# Patient Record
Sex: Male | Born: 1940 | Race: White | Smoking: Never smoker
Health system: Southern US, Community
[De-identification: ages and names within clinical notes are randomized; demographics above are authoritative.]

## PROBLEM LIST (undated history)

## (undated) DIAGNOSIS — R1310 Dysphagia, unspecified: Secondary | ICD-10-CM

## (undated) DIAGNOSIS — D649 Anemia, unspecified: Secondary | ICD-10-CM

## (undated) DIAGNOSIS — E119 Type 2 diabetes mellitus without complications: Secondary | ICD-10-CM

## (undated) DIAGNOSIS — G609 Hereditary and idiopathic neuropathy, unspecified: Secondary | ICD-10-CM

## (undated) DIAGNOSIS — I10 Essential (primary) hypertension: Secondary | ICD-10-CM

## (undated) DIAGNOSIS — I639 Cerebral infarction, unspecified: Secondary | ICD-10-CM

## (undated) DIAGNOSIS — M1990 Unspecified osteoarthritis, unspecified site: Secondary | ICD-10-CM

## (undated) DIAGNOSIS — J449 Chronic obstructive pulmonary disease, unspecified: Secondary | ICD-10-CM

## (undated) DIAGNOSIS — IMO0001 Reserved for inherently not codable concepts without codable children: Secondary | ICD-10-CM

## (undated) DIAGNOSIS — E559 Vitamin D deficiency, unspecified: Secondary | ICD-10-CM

## (undated) DIAGNOSIS — F329 Major depressive disorder, single episode, unspecified: Secondary | ICD-10-CM

## (undated) DIAGNOSIS — E785 Hyperlipidemia, unspecified: Secondary | ICD-10-CM

## (undated) DIAGNOSIS — I1310 Hypertensive heart and chronic kidney disease without heart failure, with stage 1 through stage 4 chronic kidney disease, or unspecified chronic kidney disease: Secondary | ICD-10-CM

## (undated) DIAGNOSIS — F3289 Other specified depressive episodes: Secondary | ICD-10-CM

## (undated) HISTORY — DX: Dysphagia, unspecified: R13.10

## (undated) HISTORY — DX: Vitamin D deficiency, unspecified: E55.9

## (undated) HISTORY — DX: Essential (primary) hypertension: I10

## (undated) HISTORY — DX: Unspecified osteoarthritis, unspecified site: M19.90

## (undated) HISTORY — DX: Reserved for inherently not codable concepts without codable children: IMO0001

## (undated) HISTORY — DX: Type 2 diabetes mellitus without complications: E11.9

## (undated) HISTORY — DX: Cerebral infarction, unspecified: I63.9

## (undated) HISTORY — DX: Hyperlipidemia, unspecified: E78.5

## (undated) HISTORY — DX: Other specified depressive episodes: F32.89

## (undated) HISTORY — DX: Hypertensive heart and chronic kidney disease without heart failure, with stage 1 through stage 4 chronic kidney disease, or unspecified chronic kidney disease: I13.10

## (undated) HISTORY — DX: Anemia, unspecified: D64.9

## (undated) HISTORY — DX: Major depressive disorder, single episode, unspecified: F32.9

## (undated) HISTORY — DX: Hereditary and idiopathic neuropathy, unspecified: G60.9

## (undated) HISTORY — DX: Chronic obstructive pulmonary disease, unspecified: J44.9

---

## 2011-09-26 ENCOUNTER — Ambulatory Visit (HOSPITAL_COMMUNITY)
Admission: RE | Admit: 2011-09-26 | Discharge: 2011-09-26 | Disposition: A | Discharge status: Home / Self Care | Payer: Medicaid Other | Source: Ambulatory Visit | Attending: Internal Medicine | Admitting: Internal Medicine

## 2011-09-26 ENCOUNTER — Ambulatory Visit (HOSPITAL_COMMUNITY)
Admission: RE | Admit: 2011-09-26 | Discharge: 2011-09-26 | Disposition: A | Discharge status: Home / Self Care | Payer: Medicare Other | Source: Ambulatory Visit | Attending: Family Medicine | Admitting: Family Medicine

## 2011-09-26 DIAGNOSIS — R1310 Dysphagia, unspecified: Secondary | ICD-10-CM

## 2011-09-26 DIAGNOSIS — R1313 Dysphagia, pharyngeal phase: Secondary | ICD-10-CM | POA: Insufficient documentation

## 2011-09-26 NOTE — Procedures (Signed)
Objective Swallowing Evaluation: Outpatient Modified Barium Swallowing Study  Patient Details  Name: Eric Graves MRN: [id number] Date of Birth: 09/26/1940  Today's Date: 09/26/2011 Time: 1130-1205 SLP Time Calculation (min): 35 min  Past Medical History: No past medical history on file. Past Surgical History: No past surgical history on file. HPI:  71 y.o. male referred for an outpatient MBSS from SNF to assess swallow function and the ability to upgrade his liquids. Pmhx: COPD, CVA, and obstructive, chronic bronchitis.     Assessment / Plan / Recommendation Clinical Impression  Dysphagia Diagnosis: Mild pharyngeal phase dysphagia Clinical impression: Demonstrates a mild oropharyngeal phase dysphagia marked primarily by a sensory/motor deficit, particularly with base of tongue/oral containment of the liquid bolus resulting in premature spillage into the pharynx (nectar-thick) and laryngeal vestibule (thin liquids) before the swallow trigger.  Both silent and sensed aspiration occured with thin liquids, however the large volume was most concerning.  Given patient's sensory/motor deficits coupled with reduced vocal cord adduction protection, patient would be a very high aspiration pna risk if he proceeded to consume thin liquids exclusively.  Recommend to continue current diet with the primary SLP consideration of a water protocol with trained caregivers/staff.    Treatment Recommendation  Defer treatment plan to SLP at (Comment) (SNF level SLP)    Diet Recommendation Dysphagia 3 (Mechanical Soft);Nectar-thick liquid (consider water protocol)   Liquid Administration via: Cup Medication Administration: Whole meds with puree Supervision: Patient able to self feed;Full supervision/cueing for compensatory strategies Compensations: Small sips/bites    Follow Up Recommendations  Skilled Nursing facility    Pertinent Vitals/Pain n/a    Reason for Referral Objectively evaluate swallowing  function   Pharyngeal Phase Pharyngeal Phase: Impaired   Cervical Esophageal Phase Cervical Esophageal Phase: [facility]    Myra Rude, M.S.,CCC-SLP Pager [phone] 09/26/2011, 1:59 PM

## 2012-05-26 ENCOUNTER — Non-Acute Institutional Stay (SKILLED_NURSING_FACILITY): Payer: Medicare Other | Admitting: Adult Health

## 2012-05-26 ENCOUNTER — Encounter: Payer: Self-pay | Admitting: Adult Health

## 2012-05-26 DIAGNOSIS — I639 Cerebral infarction, unspecified: Secondary | ICD-10-CM

## 2012-05-26 DIAGNOSIS — E785 Hyperlipidemia, unspecified: Secondary | ICD-10-CM

## 2012-05-26 DIAGNOSIS — R1319 Other dysphagia: Secondary | ICD-10-CM

## 2012-05-26 DIAGNOSIS — E1149 Type 2 diabetes mellitus with other diabetic neurological complication: Secondary | ICD-10-CM

## 2012-05-26 DIAGNOSIS — I6350 Cerebral infarction due to unspecified occlusion or stenosis of unspecified cerebral artery: Secondary | ICD-10-CM

## 2012-05-26 DIAGNOSIS — I10 Essential (primary) hypertension: Secondary | ICD-10-CM

## 2012-05-26 DIAGNOSIS — G609 Hereditary and idiopathic neuropathy, unspecified: Secondary | ICD-10-CM

## 2012-05-26 DIAGNOSIS — G629 Polyneuropathy, unspecified: Secondary | ICD-10-CM

## 2012-07-21 ENCOUNTER — Encounter: Payer: Self-pay | Admitting: Adult Health

## 2012-07-21 ENCOUNTER — Non-Acute Institutional Stay (SKILLED_NURSING_FACILITY): Payer: Medicare Other | Admitting: Adult Health

## 2012-07-21 DIAGNOSIS — E785 Hyperlipidemia, unspecified: Secondary | ICD-10-CM

## 2012-07-21 DIAGNOSIS — E1149 Type 2 diabetes mellitus with other diabetic neurological complication: Secondary | ICD-10-CM | POA: Insufficient documentation

## 2012-07-21 DIAGNOSIS — G609 Hereditary and idiopathic neuropathy, unspecified: Secondary | ICD-10-CM

## 2012-07-21 DIAGNOSIS — I10 Essential (primary) hypertension: Secondary | ICD-10-CM | POA: Insufficient documentation

## 2012-07-21 DIAGNOSIS — R1319 Other dysphagia: Secondary | ICD-10-CM

## 2012-07-21 DIAGNOSIS — G629 Polyneuropathy, unspecified: Secondary | ICD-10-CM | POA: Insufficient documentation

## 2012-07-21 NOTE — Assessment & Plan Note (Signed)
Will continue lipitor 20 mg daily  

## 2012-07-21 NOTE — Progress Notes (Signed)
Patient ID: Eric Graves, male   DOB: 23-Aug-1940, 72 y.o.   MRN: [id number]  FACILITY: [name]   No Known Allergies   Chief Complaint  Patient presents with  . Hospitalization Follow-up    HPI: He is being seen for the medical management of his chronic illnesses; there are no concerns being voiced by the nursing staff; there are no signs of aspiration present there is no overall change in his status; he cannot fully participate in the hpi or ros.   Past Medical History  Diagnosis Date  . Hyperlipidemia   . Diabetes mellitus without complication   . Anemia   . Hypertension   . Stroke   . COPD (chronic obstructive pulmonary disease)   . Arthritis   . Dysphagia     No past surgical history on file.  VITAL SIGNS BP 123/67  Pulse 68  Ht 5\' 5"  (1.651 m)  Wt 159 lb (72.122 kg)  BMI 26.46 kg/m2   Patient's Medications  New Prescriptions   No medications on file  Previous Medications   ATORVASTATIN (LIPITOR) 20 MG TABLET    Take 20 mg by mouth daily.   CARVEDILOL (COREG) 6.25 MG TABLET    Take 6.25 mg by mouth 2 (two) times daily with a meal.   CLOPIDOGREL (PLAVIX) 75 MG TABLET    Take 75 mg by mouth daily.   FOOD THICKENER (THICK IT) POWD    Take 1 Container by mouth as needed (for nectar thick liquids).   GABAPENTIN (NEURONTIN) 400 MG CAPSULE    Take 400 mg by mouth daily.   INSULIN ASPART (NOVOLOG) 100 UNIT/ML INJECTION    Inject 5 Units into the skin 3 (three) times daily before meals. 5 units prior to meals for cbg >= 150   INSULIN GLARGINE (LANTUS) 100 UNIT/ML INJECTION    Inject 20 Units into the skin at bedtime.   IPRATROPIUM-ALBUTEROL (DUONEB) 0.5-2.5 (3) MG/3ML SOLN    Take 3 mLs by nebulization every 6 (six) hours as needed.  Modified Medications   No medications on file  Discontinued Medications   DICLOFENAC SODIUM (VOLTAREN) 1 % GEL    Apply 2 g topically 4 (four) times daily as needed (to fingers of left hand).    SIGNIFICANT DIAGNOSTIC EXAMS  07-31-11:  CXR:  mild b/l perihilar interstitial pattern, c/w pneumonitis or fluid overload  LABS REVIEWED:  01-27-12: wbc 10.8; hgb 13.7; hct 40.9; mcv 93.4; plt 330; glcuose 115; bun 35; creat 1.46; k+ 5.4;na++ 143 liver normal albumin 3.4; hgb a1c 7.2    Review of Systems  Unable to perform ROS   Physical Exam  Constitutional: He appears well-developed and well-nourished.  Neck: Neck supple. No thyromegaly present.  Cardiovascular: Normal rate, regular rhythm and intact distal pulses.   Respiratory: Effort normal and breath sounds normal.  GI: Soft. Bowel sounds are normal. He exhibits no distension. There is no tenderness.  Musculoskeletal: He exhibits no edema.  Has hemiparesis  Neurological: He is alert.  Orientated to self  Skin: Skin is warm and dry.  Psychiatric: He has a normal mood and affect.       ASSESSMENT/ PLAN:  Type II or unspecified type diabetes mellitus with neurological manifestations, not stated as uncontrolled(250.60) Will continue lantus 20 units daily and novolog 5 units prior to meals for cbg >=150 and will monitor  Other and unspecified hyperlipidemia Will continue lipitor 20 mg daily  Peripheral neuropathy Will continue neurontin 400 mg daily   Essential hypertension, benign Will  continue coreg 6.25 mg twice daily   Other dysphagia Will continue nectar thick liquids; no signs of aspiration present.    Will check cbc; cmp; lipids; hgb a1c next blood draw

## 2012-07-21 NOTE — Assessment & Plan Note (Signed)
Will continue neurontin 400 mg daily

## 2012-07-21 NOTE — Assessment & Plan Note (Signed)
Will continue lantus 20 units daily and novolog 5 units prior to meals for cbg >=150 and will monitor

## 2012-07-21 NOTE — Assessment & Plan Note (Signed)
Will continue nectar thick liquids; no signs of aspiration present.

## 2012-07-21 NOTE — Assessment & Plan Note (Signed)
Will continue coreg 6.25 mg twice daily

## 2012-08-06 DIAGNOSIS — I639 Cerebral infarction, unspecified: Secondary | ICD-10-CM | POA: Insufficient documentation

## 2012-08-06 NOTE — Assessment & Plan Note (Signed)
Is stable will continue plavix 75 mg daily and will monitor his status

## 2012-08-06 NOTE — Assessment & Plan Note (Signed)
Is stable will continue neurontin 400 mg daily and will continue to monitor his status

## 2012-08-06 NOTE — Assessment & Plan Note (Addendum)
Will continue lantus 20 units nightly with novolog 5 units prior to meals for cbg >=150

## 2012-08-06 NOTE — Assessment & Plan Note (Signed)
Will continue lipitor 20 mg daily  

## 2012-08-06 NOTE — Progress Notes (Signed)
Patient ID: Eric Graves, male   DOB: [birth year], 72 y.o.   MRN: [id number]  FACILITY: [name]  No Known Allergies   Chief Complaint  Patient presents with  . Medical Managment of Chronic Issues    HPI: He is being seen for the management of chronic illnesses. There is no signsificant change in his overall status he remains on nectar thick liquids without aspiration present; his blood pressure is controlled with coreg.   Past Medical History  Diagnosis Date  . Hyperlipidemia   . Diabetes mellitus without complication   . Anemia   . Hypertension   . Stroke   . COPD (chronic obstructive pulmonary disease)   . Arthritis   . Dysphagia     History reviewed. No pertinent past surgical history.  VITAL SIGNS BP 125/76  Pulse 70  Ht 5\' 5"  (1.651 m)  Wt 159 lb (72.122 kg)  BMI 26.46 kg/m2   Patient's Medications  New Prescriptions   No medications on file  Previous Medications   ATORVASTATIN (LIPITOR) 20 MG TABLET    Take 20 mg by mouth daily.   CARVEDILOL (COREG) 6.25 MG TABLET    Take 6.25 mg by mouth 2 (two) times daily with a meal.   CLOPIDOGREL (PLAVIX) 75 MG TABLET    Take 75 mg by mouth daily.   FOOD THICKENER (THICK IT) POWD    Take 1 Container by mouth as needed (for nectar thick liquids).   GABAPENTIN (NEURONTIN) 400 MG CAPSULE    Take 400 mg by mouth daily.   INSULIN ASPART (NOVOLOG) 100 UNIT/ML INJECTION    Inject 5 Units into the skin 3 (three) times daily before meals. 5 units prior to meals for cbg >= 150   INSULIN GLARGINE (LANTUS) 100 UNIT/ML INJECTION    Inject 20 Units into the skin at bedtime.   IPRATROPIUM-ALBUTEROL (DUONEB) 0.5-2.5 (3) MG/3ML SOLN    Take 3 mLs by nebulization every 6 (six) hours as needed.  Modified Medications   No medications on file  Discontinued Medications   DICLOFENAC SODIUM (VOLTAREN) 1 % GEL    Apply 2 g topically 4 (four) times daily as needed (to fingers of left hand).    SIGNIFICANT DIAGNOSTIC EXAMS  07-31-11: CXR:   mild b/l perihilar interstitial pattern, c/w pneumonitis or fluid overload  LABS REVIEWED:   07-18-11: wbc 10.0; hgb 12.5 ;hct 37.3; mcv 91.0; plt 330; glucose 56; bun 32; creat 1.63; k+ 4.9; na++ 146 liver normal albumin 3.2; hgb a1c 5.8  01-27-12: wbc 10.8; hgb 13.7; hct 40.9; mcv 93.4; plt 330; glcuose 115; bun 35; creat 1.46; k+ 5.4;na++ 143 liver normal albumin 3.4; hgb a1c 7.2     Review of Systems  Unable to perform ROS  Physical Exam  Constitutional: He appears well-developed.  Neck: Neck supple. No JVD present. No thyromegaly present.  Cardiovascular: Normal rate, regular rhythm and intact distal pulses.   Respiratory: Effort normal and breath sounds normal.  GI: Soft. Bowel sounds are normal. He exhibits no distension. There is no tenderness.  Musculoskeletal: He exhibits no edema.  Hemiparesis present  Neurological: He is alert.  Skin: Skin is warm and dry.  Psychiatric: He has a normal mood and affect.       ASSESSMENT/ PLAN: Type II or unspecified type diabetes mellitus with neurological manifestations, not stated as uncontrolled(250.60) Will continue lantus 20 units nightly with novolog 5 units prior to meals for cbg >=150   Other and unspecified hyperlipidemia Will continue lipitor 20 mg  daily   Peripheral neuropathy Is stable will continue neurontin 400 mg daily and will continue to monitor his status   Essential hypertension, benign Will continue coreg 6.25 mg twice daily and will continue to monitor his status   Other dysphagia Will continue nectar thick liquids.  CVA (cerebral vascular accident) Is stable will continue plavix 75 mg daily and will monitor his status

## 2012-08-06 NOTE — Assessment & Plan Note (Signed)
Will continue coreg 6.25 mg twice daily and will continue to monitor his status

## 2012-08-06 NOTE — Assessment & Plan Note (Signed)
Will continue nectar thick liquids.

## 2012-08-18 ENCOUNTER — Non-Acute Institutional Stay (SKILLED_NURSING_FACILITY): Payer: Medicare Other | Admitting: Adult Health

## 2012-08-18 DIAGNOSIS — E785 Hyperlipidemia, unspecified: Secondary | ICD-10-CM

## 2012-08-18 DIAGNOSIS — R1319 Other dysphagia: Secondary | ICD-10-CM

## 2012-08-18 DIAGNOSIS — E1149 Type 2 diabetes mellitus with other diabetic neurological complication: Secondary | ICD-10-CM

## 2012-08-18 DIAGNOSIS — G629 Polyneuropathy, unspecified: Secondary | ICD-10-CM

## 2012-08-18 DIAGNOSIS — G609 Hereditary and idiopathic neuropathy, unspecified: Secondary | ICD-10-CM

## 2012-08-18 DIAGNOSIS — I10 Essential (primary) hypertension: Secondary | ICD-10-CM

## 2012-08-18 DIAGNOSIS — J449 Chronic obstructive pulmonary disease, unspecified: Secondary | ICD-10-CM

## 2012-08-18 DIAGNOSIS — I639 Cerebral infarction, unspecified: Secondary | ICD-10-CM

## 2012-08-18 DIAGNOSIS — I6350 Cerebral infarction due to unspecified occlusion or stenosis of unspecified cerebral artery: Secondary | ICD-10-CM

## 2012-09-01 ENCOUNTER — Non-Acute Institutional Stay (SKILLED_NURSING_FACILITY): Payer: Medicare Other | Admitting: Adult Health

## 2012-09-01 DIAGNOSIS — E875 Hyperkalemia: Secondary | ICD-10-CM

## 2012-09-09 ENCOUNTER — Non-Acute Institutional Stay (SKILLED_NURSING_FACILITY): Payer: Medicare Other | Admitting: Internal Medicine

## 2012-09-09 DIAGNOSIS — E1149 Type 2 diabetes mellitus with other diabetic neurological complication: Secondary | ICD-10-CM

## 2012-09-09 DIAGNOSIS — E875 Hyperkalemia: Secondary | ICD-10-CM

## 2012-09-09 DIAGNOSIS — I6350 Cerebral infarction due to unspecified occlusion or stenosis of unspecified cerebral artery: Secondary | ICD-10-CM

## 2012-09-09 DIAGNOSIS — I10 Essential (primary) hypertension: Secondary | ICD-10-CM

## 2012-09-09 DIAGNOSIS — I639 Cerebral infarction, unspecified: Secondary | ICD-10-CM

## 2012-09-09 DIAGNOSIS — J4489 Other specified chronic obstructive pulmonary disease: Secondary | ICD-10-CM

## 2012-09-09 DIAGNOSIS — J449 Chronic obstructive pulmonary disease, unspecified: Secondary | ICD-10-CM

## 2012-09-09 DIAGNOSIS — R1319 Other dysphagia: Secondary | ICD-10-CM

## 2012-09-09 NOTE — Progress Notes (Signed)
Patient ID: Eric Graves, male   DOB: 1941/01/14, 72 y.o.   MRN: [id number]  Location:  [name] Provider:  Gwenith Graves. Eric Graves, D.O., C.M.D.  Code Status:  DNR Chief Complaint: medical mgt of chronic diseases and hyperkalemia on labs  HPI:  72 yo white male with h/lo dementia, dysphagia, prior stroke, DMII with neurologic manifestations, COPD, afib, and vitamin D deficiency was seen for regular f/u and d/t abnormal potassium on labs from 09/02/12.  K was 5.7 6/25, but improved to 5.4 on 7/9.  Renal function also improved from 1.62 to 1.51.    Review of Systems:  Review of Systems  Unable to perform ROS: dementia    Medications: Patient's Medications  New Prescriptions   LORAZEPAM (ATIVAN) 1 MG TABLET    Take 1 tablet (1 mg total) by mouth every 4 (four) hours as needed for anxiety.   MORPHINE (ROXANOL) 20 MG/ML CONCENTRATED SOLUTION    Take 0.25 mLs (5 mg total) by mouth every 4 (four) hours as needed for severe pain (respiratory distress).   SCOPOLAMINE (TRANSDERM-SCOP) 1.5 MG    Place 1 patch (1.5 mg total) onto the skin every 3 (three) days.  Previous Medications   No medications on file  Modified Medications   No medications on file  Discontinued Medications   ATORVASTATIN (LIPITOR) 20 MG TABLET    Take 20 mg by mouth daily.   CARVEDILOL (COREG) 6.25 MG TABLET    Take 6.25 mg by mouth 2 (two) times daily with a meal.   CLOPIDOGREL (PLAVIX) 75 MG TABLET    Take 75 mg by mouth daily.   FOOD THICKENER (THICK IT) POWD    Take 1 Container by mouth as needed (for nectar thick liquids).   GABAPENTIN (NEURONTIN) 400 MG CAPSULE    Take 400 mg by mouth daily.   INSULIN ASPART (NOVOLOG) 100 UNIT/ML INJECTION    Inject 5 Units into the skin 3 (three) times daily before meals. 5 units prior to meals for cbg >= 150   INSULIN GLARGINE (LANTUS) 100 UNIT/ML INJECTION    Inject 20 Units into the skin at bedtime.   IPRATROPIUM-ALBUTEROL (DUONEB) 0.5-2.5 (3) MG/3ML SOLN    Take 3 mLs  by nebulization every 6 (six) hours as needed.    Physical Exam: Filed Vitals:   09/09/12 1216  BP: 128/70  Pulse: 74  Temp: 97.9 F (36.6 C)  Resp: 18   Physical Exam  Constitutional: No distress.  HENT:  Head: Normocephalic and atraumatic.  Cardiovascular: Intact distal pulses.   irreg irreg  Pulmonary/Chest: Effort normal and breath sounds normal. No respiratory distress.  Abdominal: Soft. Bowel sounds are normal. He exhibits no distension and no mass. There is no tenderness.  Neurological: He is alert.  Skin: Skin is warm and dry.  Psychiatric:  Confused, does not speak much    Labs reviewed: 08/19/12:  K 5.7, cr 1.62 09/02/12:  Na 143, K 5.4, BUN 35, cr 1.51, glucose 94  Assessment/Plan 1. Hyperkalemia -potassium has improved, cont to monitor  2. CVA (cerebral vascular accident) -was initially here for rehab and now long term care due to weakness, dysphagia, dysarthria, dementia -cont plavix and bp, glucose and lipid control 3. COPD (chronic obstructive pulmonary disease) -cont current therapies with duonebs q6 hrs prn, not O2-dependent  4. Type II or unspecified type diabetes mellitus with neurological manifestations, not stated as uncontrolled -cont lantus 20 units at bedtime with meal coverage novolog  5. Essential hypertension, benign -cont  current bp meds for secondary stroke prevention  6. Other dysphagia -cont specialty diet, aspiration precautions

## 2012-09-25 ENCOUNTER — Encounter: Payer: Self-pay | Admitting: *Deleted

## 2012-11-26 ENCOUNTER — Encounter: Payer: Self-pay | Admitting: Internal Medicine

## 2012-11-26 ENCOUNTER — Non-Acute Institutional Stay (SKILLED_NURSING_FACILITY): Payer: Medicare Other | Admitting: Internal Medicine

## 2012-11-26 DIAGNOSIS — I10 Essential (primary) hypertension: Secondary | ICD-10-CM

## 2012-11-26 DIAGNOSIS — E785 Hyperlipidemia, unspecified: Secondary | ICD-10-CM

## 2012-11-26 DIAGNOSIS — I6350 Cerebral infarction due to unspecified occlusion or stenosis of unspecified cerebral artery: Secondary | ICD-10-CM

## 2012-11-26 DIAGNOSIS — I639 Cerebral infarction, unspecified: Secondary | ICD-10-CM

## 2012-11-26 DIAGNOSIS — E559 Vitamin D deficiency, unspecified: Secondary | ICD-10-CM | POA: Insufficient documentation

## 2012-11-26 DIAGNOSIS — E1149 Type 2 diabetes mellitus with other diabetic neurological complication: Secondary | ICD-10-CM

## 2012-11-26 DIAGNOSIS — G609 Hereditary and idiopathic neuropathy, unspecified: Secondary | ICD-10-CM

## 2012-11-26 DIAGNOSIS — G629 Polyneuropathy, unspecified: Secondary | ICD-10-CM

## 2012-11-26 NOTE — Assessment & Plan Note (Signed)
Stable on plavix

## 2012-11-26 NOTE — Progress Notes (Signed)
MRN: [id number] Name: Eric Graves  Sex: male Age: 72 y.o. DOB: November 22, 1940  PSC #: [facility]umble Facility/Room: 232A Level Of Care: SNF Provider: Merrilee Seashore D Emergency Contacts: Extended Emergency Contact Information Primary Emergency Contact: Smol[place]ki,Michael  [place] Mobile Phone: [phone] Relation: Son  Code Status: FULL  Allergies: Review of patient's allergies indicates no known allergies.  Chief Complaint  Patient presents with  . Medical Managment of Chronic Issues    HPI: Patient is 72 y.o. male who is s/p CVA with R hemiparesis.   Past Medical History  Diagnosis Date  . Hyperlipidemia   . Diabetes mellitus without complication   . Anemia   . Hypertension   . Stroke   . COPD (chronic obstructive pulmonary disease)   . Arthritis   . Dysphagia   . Vitamin D deficiency   . Depressive disorder, not elsewhere classified   . Unspecified hereditary and idiopathic peripheral neuropathy   . 9 completed weeks of gestation   . Malignant hypertensive heart/renal dis     History reviewed. No pertinent past surgical history.    Medication List       This list is accurate as of: 11/26/12  6:23 PM.  Always use your most recent med list.               atorvastatin 20 MG tablet  Commonly known as:  LIPITOR  Take 20 mg by mouth daily.     carvedilol 6.25 MG tablet  Commonly known as:  COREG  Take 6.25 mg by mouth 2 (two) times daily with a meal.     clopidogrel 75 MG tablet  Commonly known as:  PLAVIX  Take 75 mg by mouth daily.     food thickener Powd  Commonly known as:  THICK IT  Take 1 Container by mouth as needed (for nectar thick liquids).     gabapentin 400 MG capsule  Commonly known as:  NEURONTIN  Take 400 mg by mouth daily.     insulin aspart 100 UNIT/ML injection  Commonly known as:  novoLOG  Inject 5 Units into the skin 3 (three) times daily before meals. 5 units prior to meals for cbg >= 150     insulin glargine  100 UNIT/ML injection  Commonly known as:  LANTUS  Inject 20 Units into the skin at bedtime.     ipratropium-albuterol 0.5-2.5 (3) MG/3ML Soln  Commonly known as:  DUONEB  Take 3 mLs by nebulization every 6 (six) hours as needed.        No orders of the defined types were placed in this encounter.     There is no immunization history on file for this patient.  History  Substance Use Topics  . Smoking status: Unknown If Ever Smoked  . Smokeless tobacco: Not on file  . Alcohol Use: Not on file    Family history is noncontributory    Review of Systems - UTO;nurses report no problems   Filed Vitals:   11/26/12 1651  BP: 152/71  Pulse: 64  Temp: 98.8 F (37.1 C)  Resp: 21    Physical Exam  GENERAL APPEARANCE: sleeping, awakens to voice and makes eye contact but didn't speak; Appropriately groomed. No acute distress.  SKIN: No diaphoresis rash, or wounds HEAD: Normocephalic, atraumatic  EYES: Conjunctiva/lids clear. Pupils round, reactive. EOMs intact.  EARS: External exam WNL, canals clear. Hearing grossly normal.  NOSE: No deformity or discharge.  MOUTh- lips without lesions RESPIRATORY: Breathing is even, unlabored.  Lung sounds are clear   CARDIOVASCULAR: Heart RRR no murmurs, rubs or gallops. No peripheral edema.  VENOUS: No varicosities. No venous stasis skin changes  GASTROINTESTINAL: Abdomen is soft, non-tender, not distended w/ normal bowel sounds. GENITOURINARY: Bladder non tender, not distended  MUSCULOSKELETAL: mild contracture RUE and RLE NEUROLOGIC: Oriented X1. Cranial nerves 2-12 grossly intact.;R side paralysis PSYCHIATRIC: Mood and affect appropriate to situation, no behavioral issues  Patient Active Problem List   Diagnosis Date Noted  . Vitamin D deficiency   . CVA (cerebral vascular accident) 08/06/2012  . Type II or unspecified type diabetes mellitus with neurological manifestations, not stated as uncontrolled(250.60) 07/21/2012  . Other and  unspecified hyperlipidemia 07/21/2012  . Peripheral neuropathy 07/21/2012  . Essential hypertension, benign 07/21/2012  . Other dysphagia 07/21/2012    CBC   06/2012- WBC 8.3  12.1/36.1  PLT 276   CMP 6/25 /2014  145, 5.5, 112, 26, 140, 36/1.52    No change from prior   Assessment and Plan  CVA (cerebral vascular accident) Stable;on plavix  Essential hypertension, benign A little high;on coreg;will watch  Type II or unspecified type diabetes mellitus with neurological manifestations, not stated as uncontrolled(250.60) Stable-last HbA1c 06/2012 was 7.0;continue present regimen; FLP 06/2012  LDL -55 , HDL 21, TG 172  Peripheral neuropathy Continue Neurontin  Other and unspecified hyperlipidemia Continue lipitor 20 mg    Margit Hanks, MD  NO NEW STUDIES NEEDED AT THIS TIME

## 2012-11-26 NOTE — Assessment & Plan Note (Signed)
Continue Neurontin. 

## 2012-11-26 NOTE — Assessment & Plan Note (Signed)
Continue lipitor 20 mg 

## 2012-11-26 NOTE — Assessment & Plan Note (Addendum)
Stable-last HbA1c 06/2012 was 7.0;continue present regimen; FLP 06/2012  LDL -55 , HDL 21, TG 172

## 2012-11-26 NOTE — Assessment & Plan Note (Signed)
A little high;on coreg;will watch

## 2012-12-18 ENCOUNTER — Encounter: Payer: Self-pay | Admitting: Internal Medicine

## 2012-12-18 ENCOUNTER — Non-Acute Institutional Stay (SKILLED_NURSING_FACILITY): Payer: Medicare Other | Admitting: Internal Medicine

## 2012-12-18 DIAGNOSIS — J449 Chronic obstructive pulmonary disease, unspecified: Secondary | ICD-10-CM

## 2012-12-18 DIAGNOSIS — R0989 Other specified symptoms and signs involving the circulatory and respiratory systems: Secondary | ICD-10-CM

## 2012-12-18 NOTE — Progress Notes (Signed)
MRN: [id number] Name: Eric Graves  Sex: male Age: 72 y.o. DOB: 03-20-1940  PSC #: [facility] Rumble Facility/Room: 232A Level Of Care: SNF Provider: Merrilee Seashore D Emergency Contacts: Extended Emergency Contact Information Primary Emergency Contact: [place],Michael  [place] Mobile Phone: [phone] Relation: Son  Code Status:  FULL  Allergies: Review of patient's allergies indicates no known allergies.  Chief Complaint  Patient presents with  . Acute Visit    HPI: Patient is 72 y.o. male who the nurse asked me to see because he has had a congested cough for several days. No fever.  Past Medical History  Diagnosis Date  . Hyperlipidemia   . Diabetes mellitus without complication   . Anemia   . Hypertension   . Stroke   . COPD (chronic obstructive pulmonary disease)   . Arthritis   . Dysphagia   . Vitamin D deficiency   . Depressive disorder, not elsewhere classified   . Unspecified hereditary and idiopathic peripheral neuropathy   . 9 completed weeks of gestation   . Malignant hypertensive heart/renal dis     History reviewed. No pertinent past surgical history.    Medication List       This list is accurate as of: 12/18/12  5:01 PM.  Always use your most recent med list.               atorvastatin 20 MG tablet  Commonly known as:  LIPITOR  Take 20 mg by mouth daily.     carvedilol 6.25 MG tablet  Commonly known as:  COREG  Take 6.25 mg by mouth 2 (two) times daily with a meal.     clopidogrel 75 MG tablet  Commonly known as:  PLAVIX  Take 75 mg by mouth daily.     food thickener Powd  Commonly known as:  THICK IT  Take 1 Container by mouth as needed (for nectar thick liquids).     gabapentin 400 MG capsule  Commonly known as:  NEURONTIN  Take 400 mg by mouth daily.     insulin aspart 100 UNIT/ML injection  Commonly known as:  novoLOG  Inject 5 Units into the skin 3 (three) times daily before meals. 5 units prior to meals for  cbg >= 150     insulin glargine 100 UNIT/ML injection  Commonly known as:  LANTUS  Inject 20 Units into the skin at bedtime.     ipratropium-albuterol 0.5-2.5 (3) MG/3ML Soln  Commonly known as:  DUONEB  Take 3 mLs by nebulization every 6 (six) hours as needed.        No orders of the defined types were placed in this encounter.     There is no immunization history on file for this patient.  History  Substance Use Topics  . Smoking status: Unknown If Ever Smoked  . Smokeless tobacco: Not on file  . Alcohol Use: Not on file    Review of Systems  DATA OBTAINED:  Nurse ROS is from her knowledge of pt GENERAL:no fevers, fatigue, appetite changes SKIN: No itching, rash HEENT: No complaint RESPIRATORY: +cough, no wheezing, no SOB CARDIAC: No chest pain, palpitations, lower extremity edema  GI: No abdominal pain, No N/V/D or constipation, No heartburn or reflux  GU: No dysuria, frequency or urgency, or incontinence  MUSCULOSKELETAL: N0 pain NEUROLOGIC: No new weakness PSYCHIATRIC: Sleeps well.   Filed Vitals:   12/18/12 1659  BP: 140/55  Pulse: 72  Temp: 98.7 F (37.1 C)  Resp: 18  Physical Exam  GENERAL APPEARANCE: Alert,minimallyconversant. Appropriately groomed. No acute distress  SKIN: No diaphoresis rash, or wounds HEENT: Unremarkable RESPIRATORY: Breathing is even, unlabored. Lung sounds decreased at bases-I can't get pt to take a deep breath on command ; at bedside per ADAMD O2 sat 2L [place] was 99%  CARDIOVASCULAR: Heart RRR no murmurs, rubs or gallops. No peripheral edema  GASTROINTESTINAL: Abdomen is soft, non-tender, not distended w/ normal bowel sounds.  GENITOURINARY: Bladder non tender, not distended  MUSCULOSKELETAL: No abnormal joints or musculature NEUROLOGIC: R side paralysis. PSYCHIATRIC; no behavioral issue    Assessment and Plan  CONGESTION AND COUGH -IN A PT WITH COPD; NEED TO R/O PNA - WILL ORDER CXR AND TREAT BASED ON THAT  Margit Hanks, MD

## 2013-01-06 NOTE — Progress Notes (Signed)
Patient ID: Eric Graves, male   DOB: [birth year], 72 y.o.   MRN: [id number]  [name] No Known Allergies  Chief Complaint  Patient presents with  . Medical Managment of Chronic Issues    HPI  He is being seen for the management of his chronic illnesses. There are no concerns being voiced by the nursing staff at this time.  He cannot fully participate in the hpi or ros. He states he is feeling good.  Past Medical History  Diagnosis Date  . Hyperlipidemia   . Diabetes mellitus without complication   . Anemia   . Hypertension   . Stroke   . Arthritis   . Dysphagia   . Vitamin D deficiency   . Depressive disorder, not elsewhere classified   . Unspecified hereditary and idiopathic peripheral neuropathy   . 9 completed weeks of gestation   . Malignant hypertensive heart/renal dis   . COPD (chronic obstructive pulmonary disease)     No past surgical history on file.  Filed Vitals:   08/18/12 1454  BP: 145/69  Pulse: 69  Height: 5\' 5"  (1.651 m)  Weight: 163 lb (73.936 kg)    MEDICATIONS  Nectar thick liquids Coreg 6.25 mg twice daily duoneb every 6 hours as needed lantus 20 units daily lipitor 20 mg daily neurontin 400 mg daily novolog 5 units prior to meals for cbg >=150 plavix 75 mg daily      SIGNIFICANT DIAGNOSTIC EXAMS  07-31-11: CXR:  mild b/l perihilar interstitial pattern, c/w pneumonitis or fluid overload  LABS REVIEWED:  01-27-12: wbc 10.8; hgb 13.7; hct 40.9; mcv 93.4; plt 330; glcuose 115; bun 35; creat 1.46; k+ 5.4;na++ 143liver normal albumin 3.4; hgb a1c 7.2  07-22-12: wbc 8.3; hgb 12.1; hct 36.1 ;mcv 91.;9 plt 271; glucose 115; bun 36; creat 1.12; k+5.7; na++ 141; liver normal albumin 2.7; hgb a1c 7.0    Review of Systems  Unable to perform ROS   Physical Exam  Constitutional: He appears well-developed and well-nourished.  Neck: Neck supple. No thyromegaly present.  Cardiovascular: Normal rate, regular rhythm and intact distal pulses.    Respiratory: Effort normal and breath sounds normal.  GI: Soft. Bowel sounds are normal. He exhibits no distension. There is no tenderness.  Musculoskeletal: He exhibits no edema.  Has hemiparesis  Neurological: He is alert.  Orientated to self  Skin: Skin is warm and dry.  Psychiatric: He has a normal mood and affect.    ASSESSMENT/ PLAN:  1. Dysphagia: no signs of aspiration present will continue nectar thick liquids and will monitor   2. Peripheral neuropathy is stable will continue neurontin 400 mg daily and will monitor   3. Diabetes: is stable will continue lantus 20 units daily and novolog 5 units prior to meals for cbg >=150  4. Cva: is without change in status is presently on plavix 75 mg daily will not make changes will monitor his status    5. Copd: is stable will continue duonebs every 6hours as needed and will monitor  6.  Hypertension: is stable will continue coreg 6.25 mg twice daily and will monitor   7. Dyslipidemia: will continue lipitor 20 mg daily   Will check bmp next draw

## 2013-01-11 ENCOUNTER — Non-Acute Institutional Stay (SKILLED_NURSING_FACILITY): Payer: Medicare Other | Admitting: Nurse Practitioner

## 2013-01-11 ENCOUNTER — Encounter: Payer: Self-pay | Admitting: Nurse Practitioner

## 2013-01-11 DIAGNOSIS — I639 Cerebral infarction, unspecified: Secondary | ICD-10-CM

## 2013-01-11 DIAGNOSIS — G609 Hereditary and idiopathic neuropathy, unspecified: Secondary | ICD-10-CM

## 2013-01-11 DIAGNOSIS — G629 Polyneuropathy, unspecified: Secondary | ICD-10-CM

## 2013-01-11 DIAGNOSIS — I6350 Cerebral infarction due to unspecified occlusion or stenosis of unspecified cerebral artery: Secondary | ICD-10-CM

## 2013-01-11 DIAGNOSIS — I10 Essential (primary) hypertension: Secondary | ICD-10-CM

## 2013-01-11 DIAGNOSIS — E785 Hyperlipidemia, unspecified: Secondary | ICD-10-CM

## 2013-01-11 DIAGNOSIS — J449 Chronic obstructive pulmonary disease, unspecified: Secondary | ICD-10-CM

## 2013-01-11 DIAGNOSIS — E1149 Type 2 diabetes mellitus with other diabetic neurological complication: Secondary | ICD-10-CM

## 2013-01-11 NOTE — Progress Notes (Signed)
Patient ID: Eric Graves, male   DOB: 07-May-1940, 72 y.o.   MRN: [id number] Nursing Home Location:  [name]   Place of Service: SNF (31)  PCP: REED, TIFFANY, DO  No Known Allergies  Chief Complaint  Patient presents with  . Medical Managment of Chronic Issues    HPI:  Patient is 72 y.o. male who is a LTR of [facility] with pmh CVA with R hemiparesis, DM, hyperlipidemia, HTN, anemia, Arthritis COPD on O2, who is being seen today for routine follow up. Pt does not have any complaints and nursing without concerns at this time.   Review of Systems:  Review of Systems  Unable to perform ROS    Past Medical History  Diagnosis Date  . Hyperlipidemia   . Diabetes mellitus without complication   . Anemia   . Hypertension   . Stroke   . Arthritis   . Dysphagia   . Vitamin D deficiency   . Depressive disorder, not elsewhere classified   . Unspecified hereditary and idiopathic peripheral neuropathy   . 9 completed weeks of gestation   . Malignant hypertensive heart/renal dis   . COPD (chronic obstructive pulmonary disease)    No past surgical history on file. Social History:   has no tobacco, alcohol, and drug history on file.  No family history on file.  Medications: Patient's Medications  New Prescriptions   No medications on file  Previous Medications   ATORVASTATIN (LIPITOR) 20 MG TABLET    Take 20 mg by mouth daily.   CARVEDILOL (COREG) 6.25 MG TABLET    Take 6.25 mg by mouth 2 (two) times daily with a meal.   CLOPIDOGREL (PLAVIX) 75 MG TABLET    Take 75 mg by mouth daily.   FOOD THICKENER (THICK IT) POWD    Take 1 Container by mouth as needed (for nectar thick liquids).   GABAPENTIN (NEURONTIN) 400 MG CAPSULE    Take 400 mg by mouth daily.   INSULIN ASPART (NOVOLOG) 100 UNIT/ML INJECTION    Inject 5 Units into the skin 3 (three) times daily before meals. 5 units prior to meals for cbg >= 150   INSULIN GLARGINE (LANTUS) 100 UNIT/ML INJECTION     Inject 20 Units into the skin at bedtime.   IPRATROPIUM-ALBUTEROL (DUONEB) 0.5-2.5 (3) MG/3ML SOLN    Take 3 mLs by nebulization every 6 (six) hours as needed.  Modified Medications   No medications on file  Discontinued Medications   No medications on file     Physical Exam:  Filed Vitals:   01/11/13 1410  BP: 125/82  Pulse: 76  Temp: 99 F (37.2 C)  Resp: 18  SpO2: 98%    Physical Exam  Constitutional: He is well-developed, well-nourished, and in no distress. No distress.  HENT:  Mouth/Throat: Oropharynx is clear and moist. No oropharyngeal exudate.  Neck: Normal range of motion. Neck supple. No thyromegaly present.  Cardiovascular: Normal rate, regular rhythm and normal heart sounds.   Pulmonary/Chest: Effort normal and breath sounds normal. No respiratory distress. He has no wheezes.  Abdominal: Soft. Bowel sounds are normal. He exhibits no distension. There is no tenderness.  Musculoskeletal: He exhibits no edema and no tenderness.  Lymphadenopathy:    He has no cervical adenopathy.  Neurological: He is alert.  Right sided weakness  Skin: Skin is warm and dry. He is not diaphoretic.     Labs reviewed: CMP with Estimated GFR    Result: 07/22/2012 2:14 PM   (  Status: F )     C Sodium 141     135-145 mEq/L SLN   Potassium 5.7   H 3.5-5.3 mEq/L SLN C Chloride 108     96-112 mEq/L SLN   CO2 25     19-32 mEq/L SLN   Glucose 115   H 70-99 mg/dL SLN   BUN 36   H 1-61 mg/dL SLN   Creatinine 0.96   H 0.50-1.35 mg/dL SLN   Bilirubin, Total 0.4     0.3-1.2 mg/dL SLN   Alkaline Phosphatase 83     39-117 U/L SLN   AST/SGOT 17     0-37 U/L SLN   ALT/SGPT 18     0-53 U/L SLN   Total Protein 5.1   L 6.0-8.3 g/dL SLN   Albumin 2.7   L 0.4-5.4 g/dL SLN   Calcium 8.4     0.9-81.1 mg/dL SLN   Est GFR, African American 48   L  mL/min SLN   Est GFR, NonAfrican American 41   L  mL/min SLN C CBC NO Diff (Complete Blood Count)    Result: 07/22/2012 10:39 AM   ( Status: F )        WBC 8.3     4.0-10.5 K/uL SLN   RBC 3.93   L 4.22-5.81 MIL/uL SLN   Hemoglobin 12.1   L 13.0-17.0 g/dL SLN   Hematocrit 91.4   L 39.0-52.0 % SLN   MCV 91.9     78.0-100.0 fL SLN   MCH 30.8     26.0-34.0 pg SLN   MCHC 33.5     30.0-36.0 g/dL SLN   RDW 78.2     95.6-21.3 % SLN   Platelet Count 276     150-400 K/uL SLN   Lipid Profile    Result: 07/22/2012 2:14 PM   ( Status: F )       Cholesterol 110     0-200 mg/dL SLN C Triglyceride 086   H <150 mg/dL SLN   HDL Cholesterol 21   L >39 mg/dL SLN   Total Chol/HDL Ratio 5.2      Ratio SLN   VLDL Cholesterol (Calc) 34     0-40 mg/dL SLN   LDL Cholesterol (Calc) 55     0-99 mg/dL SLN C Hemoglobin V7Q    Result: 07/22/2012 12:30 PM   ( Status: F )       Hemoglobin A1C 7.0   H <5.7 % SLN C Estimated Average Glucose 154 CMP 08/19/2012 145, 5.5, 112, 26, 140, 36/1.52  Basic Metabolic Panel    Result: 09/04/2012 6:21 AM   ( Status: F )       Sodium 143     135-145 mEq/L SLN   Potassium 5.4   H 3.5-5.3 mEq/L SLN C Chloride 110     96-112 mEq/L SLN   CO2 25     19-32 mEq/L SLN   Glucose 94     70-99 mg/dL SLN   BUN 35   H 4-69 mg/dL SLN   Creatinine 6.29   H 0.50-1.35 mg/dL SLN   Calcium 8.5 CBC NO Diff (Complete Blood Count)    Result: 12/21/2012 10:54 AM   ( Status: F )     C WBC 10.5     4.0-10.5 K/uL SLN   RBC 3.80   L 4.22-5.81 MIL/uL SLN   Hemoglobin 11.8   L 13.0-17.0 g/dL SLN   Hematocrit 52.8   L 39.0-52.0 % SLN  MCV 91.6     78.0-100.0 fL SLN   MCH 31.1     26.0-34.0 pg SLN   MCHC 33.9     30.0-36.0 g/dL SLN   RDW 16.1     09.6-04.5 % SLN   Platelet Count 258    Assessment/Plan 1. Other and unspecified hyperlipidemia conts on Lipitor   2. Peripheral neuropathy Stable on neurontin daily  3. Type II or unspecified type diabetes mellitus with neurological manifestations, not stated as uncontrolled(250.60) Will follow up A1c; blood sugar log reviewed and in appropriate range at this time  4. COPD (chronic obstructive  pulmonary disease) On O2 no worsening of symptoms   5. CVA (cerebral vascular accident) conts on plavix   6. Essential hypertension, benign Patient is stable; continue current regimen. Will monitor and make changes as necessary. Follow up cmp

## 2013-01-18 NOTE — Progress Notes (Signed)
Patient ID: Eric Graves, male   DOB: 1940/06/12, 72 y.o.   MRN: [id number]     [name]  No Known Allergies  Chief Complaint  Patient presents with  . Acute Visit    follow up lab work     HPI He is being seen to follow up for his abnormal k+ level. On 07-22-12; his k+ was 5.7; on 08-20-12; his level was 5.5;. His level remains slightly elevated; but is coming down. He is not taking medications which should elevated his level and is not on supplement.  He cannot fully participate in the hpi or ros but states he feels good.,   Past Medical History  Diagnosis Date  . Hyperlipidemia   . Diabetes mellitus without complication   . Anemia   . Hypertension   . Stroke   . Arthritis   . Dysphagia   . Vitamin D deficiency   . Depressive disorder, not elsewhere classified   . Unspecified hereditary and idiopathic peripheral neuropathy   . 9 completed weeks of gestation   . Malignant hypertensive heart/renal dis   . COPD (chronic obstructive pulmonary disease)     No past surgical history on file.  Filed Vitals:   09/01/12 1532  BP: 124/72  Pulse: 90  Height: 5\' 5"  (1.651 m)  Weight: 166 lb (75.297 kg)    MEDICATIONS Nectar thick liquids Coreg 6.25 mg twice daily duoneb every 6 hours as needed lantus 20 units daily lipitor 20 mg daily neurontin 800 mg daily novolog 5 units prior to meals for cbg >=150  plavix 75 mg daily    SIGNIFICANT DIAGNOSTIC EXAMS  07-31-11: CXR:  mild b/l perihilar interstitial pattern, c/w pneumonitis or fluid overload  LABS REVIEWED:  01-27-12: wbc 10.8; hgb 13.7; hct 40.9; mcv 93.4; plt 330; glcuose 115; bun 35; creat 1.46; k+ 5.4;na++ 143liver normal albumin 3.4; hgb a1c 7.2  07-22-12: wbc 8.3; hgb 12.1; hct 36.1 ;mcv 91.;9 plt 271; glucose 115; bun 36; creat 1.12; k+5.7; na++ 141; liver normal albumin 2.7; hgb a1c 7.0  08-20-12: k+ 5.5    Review of Systems  Unable to perform ROS   Physical Exam  Constitutional: He appears  well-developed and well-nourished.  Neck: Neck supple. No thyromegaly present.  Cardiovascular: Normal rate, regular rhythm and intact distal pulses.   Respiratory: Effort normal and breath sounds normal.  GI: Soft. Bowel sounds are normal. He exhibits no distension. There is no tenderness.  Musculoskeletal: He exhibits no edema.  Has hemiparesis  Neurological: He is alert.  Orientated to self  Skin: Skin is warm and dry.  Psychiatric: He has a normal mood and affect.    ASSESSMENT/PLAN:  1. Hyperkalemia: will not make changes at this time; will continue to monitor his renal function; will repeat his bmp on the next blood draw day and will make changes in the future as indicated.

## 2013-02-09 ENCOUNTER — Encounter: Payer: Self-pay | Admitting: Internal Medicine

## 2013-02-09 ENCOUNTER — Non-Acute Institutional Stay (SKILLED_NURSING_FACILITY): Payer: Medicare Other | Admitting: Internal Medicine

## 2013-02-09 DIAGNOSIS — J111 Influenza due to unidentified influenza virus with other respiratory manifestations: Secondary | ICD-10-CM

## 2013-02-09 DIAGNOSIS — J4489 Other specified chronic obstructive pulmonary disease: Secondary | ICD-10-CM

## 2013-02-09 DIAGNOSIS — J209 Acute bronchitis, unspecified: Secondary | ICD-10-CM

## 2013-02-09 DIAGNOSIS — J440 Chronic obstructive pulmonary disease with acute lower respiratory infection: Secondary | ICD-10-CM

## 2013-02-09 DIAGNOSIS — J449 Chronic obstructive pulmonary disease, unspecified: Secondary | ICD-10-CM

## 2013-02-09 NOTE — Progress Notes (Signed)
Patient ID: Eric Graves, male   DOB: 03/07/1940, 72 y.o.   MRN: [id number]  Location:  [name]  Provider:  Gwenith Spitz. Renato Gails, D.O., C.M.D.  Code Status: full code, living will for no life prolonging measures in chart  Chief Complaint  Patient presents with  . Acute Visit    cough, congestion, on antibiotics and had CXR    HPI:  72 yo male here for long term care s/p cva, also has dementia, copd/chronic bronchitis.  Seen for acute visit due to cough and congestion.  Review of Systems:  Review of Systems  Unable to perform ROS: dementia    Medications: Patient's Medications  New Prescriptions   No medications on file  Previous Medications   ATORVASTATIN (LIPITOR) 20 MG TABLET    Take 20 mg by mouth daily.   CARVEDILOL (COREG) 6.25 MG TABLET    Take 6.25 mg by mouth 2 (two) times daily with a meal.   CLOPIDOGREL (PLAVIX) 75 MG TABLET    Take 75 mg by mouth daily.   FOOD THICKENER (THICK IT) POWD    Take 1 Container by mouth as needed (for nectar thick liquids).   GABAPENTIN (NEURONTIN) 400 MG CAPSULE    Take 400 mg by mouth daily.   INSULIN ASPART (NOVOLOG) 100 UNIT/ML INJECTION    Inject 5 Units into the skin 3 (three) times daily before meals. 5 units prior to meals for cbg >= 150   INSULIN GLARGINE (LANTUS) 100 UNIT/ML INJECTION    Inject 20 Units into the skin at bedtime.   IPRATROPIUM-ALBUTEROL (DUONEB) 0.5-2.5 (3) MG/3ML SOLN    Take 3 mLs by nebulization every 6 (six) hours as needed.  Modified Medications   No medications on file  Discontinued Medications   No medications on file    Physical Exam: Filed Vitals:   02/09/13 1914  BP: 186/71  Pulse: 95  Temp: 99.3 F (37.4 C)  Resp: 18   Physical Exam  Constitutional:  Ill-appearing white male resting in bed, on oxygen  Neck: Neck supple. No JVD present.  Cardiovascular: Normal rate, regular rhythm, normal heart sounds and intact distal pulses.   Pulmonary/Chest: Effort normal. He has no  rales.  Coarse rhonchi throughout both lung fields  Abdominal: Soft. Bowel sounds are normal. He exhibits no distension. There is no tenderness.  Lymphadenopathy:    He has no cervical adenopathy.  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 Influenza-like illness influenza a and b swab was negative Cbc, bmp stat Push po fluids x 72 hrs and if intake poor, call md/np for IVF orders avelox 400mg  daily x 7 days Vs q shift wa x 72 hrs with pox duonebs q6hrs x 72 hrs  2. Bronchitis, chronic obstructive w acute bronchitis -on abx for acute bronchitis -xray unchanged  3. COPD (chronic obstructive pulmonary disease) -may need added steroid taper if not improving in next 1-2 days

## 2013-02-15 ENCOUNTER — Encounter (HOSPITAL_COMMUNITY): Payer: Self-pay | Admitting: Emergency Medicine

## 2013-02-15 ENCOUNTER — Emergency Department (HOSPITAL_COMMUNITY): Payer: Medicare Other

## 2013-02-15 ENCOUNTER — Inpatient Hospital Stay (HOSPITAL_COMMUNITY)
Admission: EM | Admit: 2013-02-15 | Discharge: 2013-03-03 | DRG: 682 | Disposition: A | Discharge status: Hospice | Payer: Medicare Other | Attending: Internal Medicine | Admitting: Internal Medicine

## 2013-02-15 DIAGNOSIS — Z794 Long term (current) use of insulin: Secondary | ICD-10-CM

## 2013-02-15 DIAGNOSIS — I69959 Hemiplegia and hemiparesis following unspecified cerebrovascular disease affecting unspecified side: Secondary | ICD-10-CM

## 2013-02-15 DIAGNOSIS — A419 Sepsis, unspecified organism: Secondary | ICD-10-CM | POA: Diagnosis not present

## 2013-02-15 DIAGNOSIS — R1310 Dysphagia, unspecified: Secondary | ICD-10-CM | POA: Diagnosis present

## 2013-02-15 DIAGNOSIS — I4891 Unspecified atrial fibrillation: Secondary | ICD-10-CM | POA: Diagnosis not present

## 2013-02-15 DIAGNOSIS — Z79899 Other long term (current) drug therapy: Secondary | ICD-10-CM

## 2013-02-15 DIAGNOSIS — E876 Hypokalemia: Secondary | ICD-10-CM | POA: Diagnosis not present

## 2013-02-15 DIAGNOSIS — N170 Acute kidney failure with tubular necrosis: Principal | ICD-10-CM | POA: Diagnosis present

## 2013-02-15 DIAGNOSIS — E870 Hyperosmolality and hypernatremia: Secondary | ICD-10-CM | POA: Diagnosis present

## 2013-02-15 DIAGNOSIS — E874 Mixed disorder of acid-base balance: Secondary | ICD-10-CM | POA: Diagnosis not present

## 2013-02-15 DIAGNOSIS — E559 Vitamin D deficiency, unspecified: Secondary | ICD-10-CM | POA: Diagnosis present

## 2013-02-15 DIAGNOSIS — I509 Heart failure, unspecified: Secondary | ICD-10-CM | POA: Diagnosis present

## 2013-02-15 DIAGNOSIS — E861 Hypovolemia: Secondary | ICD-10-CM | POA: Diagnosis present

## 2013-02-15 DIAGNOSIS — J9600 Acute respiratory failure, unspecified whether with hypoxia or hypercapnia: Secondary | ICD-10-CM | POA: Diagnosis not present

## 2013-02-15 DIAGNOSIS — N183 Chronic kidney disease, stage 3 unspecified: Secondary | ICD-10-CM | POA: Diagnosis present

## 2013-02-15 DIAGNOSIS — Z515 Encounter for palliative care: Secondary | ICD-10-CM

## 2013-02-15 DIAGNOSIS — N390 Urinary tract infection, site not specified: Secondary | ICD-10-CM | POA: Diagnosis not present

## 2013-02-15 DIAGNOSIS — I69920 Aphasia following unspecified cerebrovascular disease: Secondary | ICD-10-CM

## 2013-02-15 DIAGNOSIS — G608 Other hereditary and idiopathic neuropathies: Secondary | ICD-10-CM | POA: Diagnosis present

## 2013-02-15 DIAGNOSIS — R000 Tachycardia, unspecified: Secondary | ICD-10-CM | POA: Diagnosis present

## 2013-02-15 DIAGNOSIS — E785 Hyperlipidemia, unspecified: Secondary | ICD-10-CM | POA: Diagnosis present

## 2013-02-15 DIAGNOSIS — I130 Hypertensive heart and chronic kidney disease with heart failure and stage 1 through stage 4 chronic kidney disease, or unspecified chronic kidney disease: Secondary | ICD-10-CM | POA: Diagnosis present

## 2013-02-15 DIAGNOSIS — J690 Pneumonitis due to inhalation of food and vomit: Secondary | ICD-10-CM | POA: Diagnosis not present

## 2013-02-15 DIAGNOSIS — J449 Chronic obstructive pulmonary disease, unspecified: Secondary | ICD-10-CM | POA: Diagnosis present

## 2013-02-15 DIAGNOSIS — J4489 Other specified chronic obstructive pulmonary disease: Secondary | ICD-10-CM | POA: Diagnosis present

## 2013-02-15 DIAGNOSIS — Z66 Do not resuscitate: Secondary | ICD-10-CM | POA: Diagnosis present

## 2013-02-15 DIAGNOSIS — IMO0002 Reserved for concepts with insufficient information to code with codable children: Secondary | ICD-10-CM

## 2013-02-15 DIAGNOSIS — F3289 Other specified depressive episodes: Secondary | ICD-10-CM | POA: Diagnosis present

## 2013-02-15 DIAGNOSIS — E1149 Type 2 diabetes mellitus with other diabetic neurological complication: Secondary | ICD-10-CM | POA: Diagnosis present

## 2013-02-15 DIAGNOSIS — Z7902 Long term (current) use of antithrombotics/antiplatelets: Secondary | ICD-10-CM

## 2013-02-15 DIAGNOSIS — J9601 Acute respiratory failure with hypoxia: Secondary | ICD-10-CM

## 2013-02-15 DIAGNOSIS — I639 Cerebral infarction, unspecified: Secondary | ICD-10-CM | POA: Diagnosis present

## 2013-02-15 DIAGNOSIS — D649 Anemia, unspecified: Secondary | ICD-10-CM | POA: Diagnosis present

## 2013-02-15 DIAGNOSIS — R627 Adult failure to thrive: Secondary | ICD-10-CM | POA: Diagnosis present

## 2013-02-15 DIAGNOSIS — F329 Major depressive disorder, single episode, unspecified: Secondary | ICD-10-CM | POA: Diagnosis present

## 2013-02-15 DIAGNOSIS — R1319 Other dysphagia: Secondary | ICD-10-CM

## 2013-02-15 DIAGNOSIS — I10 Essential (primary) hypertension: Secondary | ICD-10-CM

## 2013-02-15 DIAGNOSIS — I69991 Dysphagia following unspecified cerebrovascular disease: Secondary | ICD-10-CM

## 2013-02-15 DIAGNOSIS — N179 Acute kidney failure, unspecified: Secondary | ICD-10-CM | POA: Diagnosis present

## 2013-02-15 LAB — POCT I-STAT 3, ART BLOOD GAS (G3+)
Bicarbonate: 19.4 mEq/L — ABNORMAL LOW (ref 20.0–24.0)
TCO2: 20 mmol/L (ref 0–100)
pCO2 arterial: 33.4 mmHg — ABNORMAL LOW (ref 35.0–45.0)
pH, Arterial: 7.372 (ref 7.350–7.450)

## 2013-02-15 LAB — URINALYSIS, ROUTINE W REFLEX MICROSCOPIC
Bilirubin Urine: NEGATIVE
Glucose, UA: NEGATIVE mg/dL
Specific Gravity, Urine: 1.019 (ref 1.005–1.030)
Urobilinogen, UA: 0.2 mg/dL (ref 0.0–1.0)

## 2013-02-15 LAB — COMPREHENSIVE METABOLIC PANEL
ALT: 24 U/L (ref 0–53)
AST: 31 U/L (ref 0–37)
Albumin: 2 g/dL — ABNORMAL LOW (ref 3.5–5.2)
Calcium: 8.1 mg/dL — ABNORMAL LOW (ref 8.4–10.5)
Sodium: 155 mEq/L — ABNORMAL HIGH (ref 135–145)
Total Protein: 6 g/dL (ref 6.0–8.3)

## 2013-02-15 LAB — CBC WITH DIFFERENTIAL/PLATELET
Basophils Absolute: 0 10*3/uL (ref 0.0–0.1)
Eosinophils Absolute: 0 10*3/uL (ref 0.0–0.7)
Eosinophils Relative: 0 % (ref 0–5)
Lymphocytes Relative: 14 % (ref 12–46)
MCH: 30.5 pg (ref 26.0–34.0)
MCV: 97.4 fL (ref 78.0–100.0)
Platelets: 235 10*3/uL (ref 150–400)
RDW: 14.9 % (ref 11.5–15.5)
WBC: 13.5 10*3/uL — ABNORMAL HIGH (ref 4.0–10.5)

## 2013-02-15 LAB — URINE MICROSCOPIC-ADD ON

## 2013-02-15 LAB — GLUCOSE, CAPILLARY: Glucose-Capillary: 209 mg/dL — ABNORMAL HIGH (ref 70–99)

## 2013-02-15 MED ORDER — RESOURCE THICKENUP CLEAR PO POWD
1.0000 | ORAL | Status: DC | PRN
  Filled 2013-02-15: qty 125

## 2013-02-15 MED ORDER — INSULIN ASPART 100 UNIT/ML ~~LOC~~ SOLN
0.0000 [IU] | SUBCUTANEOUS | Status: DC
  Administered 2013-02-16: 05:00:00 3 [IU] via SUBCUTANEOUS
  Administered 2013-02-16: 1 [IU] via SUBCUTANEOUS
  Administered 2013-02-16: 3 [IU] via SUBCUTANEOUS

## 2013-02-15 MED ORDER — GABAPENTIN 400 MG PO CAPS
400.0000 mg | ORAL_CAPSULE | Freq: Every day | ORAL | Status: DC
  Filled 2013-02-15: qty 1

## 2013-02-15 MED ORDER — HEPARIN SODIUM (PORCINE) 5000 UNIT/ML IJ SOLN
5000.0000 [IU] | Freq: Three times a day (TID) | INTRAMUSCULAR | Status: DC
  Administered 2013-02-16 – 2013-02-27 (×36): 5000 [IU] via SUBCUTANEOUS
  Filled 2013-02-15 (×41): qty 1

## 2013-02-15 MED ORDER — INSULIN ASPART 100 UNIT/ML ~~LOC~~ SOLN
5.0000 [IU] | Freq: Three times a day (TID) | SUBCUTANEOUS | Status: DC
  Administered 2013-02-16: 5 [IU] via SUBCUTANEOUS

## 2013-02-15 MED ORDER — DEXTROSE 5 % IV SOLN
1.0000 g | INTRAVENOUS | Status: DC
  Administered 2013-02-16 – 2013-02-18 (×4): 1 g via INTRAVENOUS
  Filled 2013-02-15 (×5): qty 10

## 2013-02-15 MED ORDER — ONDANSETRON HCL 4 MG PO TABS: 4.0000 mg | ORAL_TABLET | Freq: Four times a day (QID) | ORAL | Status: DC | PRN

## 2013-02-15 MED ORDER — CLOPIDOGREL BISULFATE 75 MG PO TABS
75.0000 mg | ORAL_TABLET | Freq: Every day | ORAL | Status: DC
  Administered 2013-02-16: 75 mg via ORAL
  Filled 2013-02-15 (×3): qty 1

## 2013-02-15 MED ORDER — INSULIN GLARGINE 100 UNIT/ML ~~LOC~~ SOLN
20.0000 [IU] | Freq: Every day | SUBCUTANEOUS | Status: DC
  Administered 2013-02-16 (×2): 20 [IU] via SUBCUTANEOUS
  Filled 2013-02-15 (×3): qty 0.2

## 2013-02-15 MED ORDER — PIPERACILLIN-TAZOBACTAM 3.375 G IVPB
3.3750 g | Freq: Once | INTRAVENOUS | Status: AC
  Administered 2013-02-15: 3.375 g via INTRAVENOUS
  Filled 2013-02-15: qty 50

## 2013-02-15 MED ORDER — DEXTROSE-NACL 5-0.9 % IV SOLN
INTRAVENOUS | Status: DC
  Administered 2013-02-15 – 2013-02-17 (×2): via INTRAVENOUS

## 2013-02-15 MED ORDER — SODIUM CHLORIDE 0.9 % IJ SOLN
3.0000 mL | Freq: Two times a day (BID) | INTRAMUSCULAR | Status: DC
  Administered 2013-02-16 – 2013-03-02 (×13): 3 mL via INTRAVENOUS

## 2013-02-15 MED ORDER — ATORVASTATIN CALCIUM 20 MG PO TABS
20.0000 mg | ORAL_TABLET | Freq: Every day | ORAL | Status: DC
  Filled 2013-02-15: qty 1

## 2013-02-15 MED ORDER — ONDANSETRON HCL 4 MG/2ML IJ SOLN: 4.0000 mg | Freq: Four times a day (QID) | INTRAMUSCULAR | Status: DC | PRN

## 2013-02-15 MED ORDER — VANCOMYCIN HCL IN DEXTROSE 1-5 GM/200ML-% IV SOLN
1000.0000 mg | Freq: Once | INTRAVENOUS | Status: AC
  Administered 2013-02-15: 1000 mg via INTRAVENOUS
  Filled 2013-02-15: qty 200

## 2013-02-15 MED ORDER — CARVEDILOL 6.25 MG PO TABS
6.2500 mg | ORAL_TABLET | Freq: Two times a day (BID) | ORAL | Status: DC
  Administered 2013-02-16: 07:00:00 6.25 mg via ORAL
  Filled 2013-02-15 (×3): qty 1

## 2013-02-15 NOTE — H&P (Signed)
[facility] History and Physical  Eric Graves JJO:[id number] DOB: [birth year]    PCP:   Bufford Spikes, DO   Chief Complaint: shortness of breath.  HPI: Eric Graves is an 72 y.o. male with hx of HTN, CVA with left side hemiplegia, dysphagia requiring nectar thickened food, HTN, hx of COPD, brought to the ER as he was having increase shortness of breath.  He does not communicate and denied he was having shortness of breath.  Evalaution in the ER showed CXR with mild vascular congestion, AKI with Cr of 6.64 and BUN of 132.  His baseline Cr was 1.7.  He has a WBC of 13K, a Na of 155, and normal LFTs.  Hospitalist was asked to admit him for AKI.   He was started on VAN/Zosyn for ? PNA.  Rewiew of Systems: Unable to obtain ROS.   Past Medical History  Diagnosis Date  . Hyperlipidemia   . Diabetes mellitus without complication   . Anemia   . Hypertension   . Stroke   . Arthritis   . Dysphagia   . Vitamin D deficiency   . Depressive disorder, not elsewhere classified   . Unspecified hereditary and idiopathic peripheral neuropathy   . 9 completed weeks of gestation   . Malignant hypertensive heart/renal dis   . COPD (chronic obstructive pulmonary disease)     History reviewed. No pertinent past surgical history.  Medications:  HOME MEDS: Prior to Admission medications   Medication Sig Start Date End Date Taking? Authorizing Provider  atorvastatin (LIPITOR) 20 MG tablet Take 20 mg by mouth daily.    Historical Provider, MD  carvedilol (COREG) 6.25 MG tablet Take 6.25 mg by mouth 2 (two) times daily with a meal.    Historical Provider, MD  clopidogrel (PLAVIX) 75 MG tablet Take 75 mg by mouth daily.    Historical Provider, MD  food thickener (THICK IT) POWD Take 1 Container by mouth as needed (for nectar thick liquids).    Historical Provider, MD  gabapentin (NEURONTIN) 400 MG capsule Take 400 mg by mouth daily.    Historical Provider, MD  insulin aspart (NOVOLOG) 100  UNIT/ML injection Inject 5 Units into the skin 3 (three) times daily before meals. 5 units prior to meals for cbg >= 150    Historical Provider, MD  insulin glargine (LANTUS) 100 UNIT/ML injection Inject 20 Units into the skin at bedtime.    Historical Provider, MD  ipratropium-albuterol (DUONEB) 0.5-2.5 (3) MG/3ML SOLN Take 3 mLs by nebulization every 6 (six) hours as needed.    Historical Provider, MD     Allergies:  No Known Allergies  Social History:   has no tobacco, alcohol, and drug history on file.  Family History: History reviewed. No pertinent family history.   Physical Exam: Filed Vitals:   02/15/13 1915 02/15/13 1930 02/15/13 1945 02/15/13 2000  BP: 110/58 103/58 130/60 119/59  Pulse: 88 92 88 94  Temp:      TempSrc:      Resp: 21 22 19 21   SpO2: 94% 94% 95% 95%   Blood pressure 119/59, pulse 94, temperature 100 F (37.8 C), temperature source Rectal, resp. rate 21, SpO2 95.00%.  GEN:  Pleasant  patient lying in the stretcher in no acute distress; cooperative with exam. PSYCH:  alert and oriented x4; does not appear anxious or depressed; affect is appropriate. HEENT: Mucous membranes pink and anicteric; PERRLA; EOM intact; no cervical lymphadenopathy nor thyromegaly or carotid bruit; no JVD; There were no  stridor. Neck is very supple. Breasts:: Not examined CHEST WALL: No tenderness CHEST: Normal respiration, scattered rhonchi. HEART: Regular rate and rhythm.  There are no murmur, rub, or gallops.   BACK: No kyphosis or scoliosis; no CVA tenderness ABDOMEN: soft and non-tender; no masses, no organomegaly, normal abdominal bowel sounds; no pannus; no intertriginous candida. There is no rebound and no distention. Rectal Exam: Not done EXTREMITIES: No bone or joint deformity; age-appropriate arthropathy of the hands and knees; no edema; no ulcerations.  There is no calf tenderness. Genitalia: not examined PULSES: 2+ and symmetric SKIN: Normal hydration no rash or  ulceration CNS: Cranial nerves 2-12 grossly intact no focal lateralizing neurologic deficit.  Speech is fluent; uvula elevated with phonation, facial symmetry and tongue midline. He has left sided hemiplegia. Labs on Admission:  Basic Metabolic Panel:  Recent Labs Lab 02/15/13 1908  NA 155*  K 4.4  CL 117*  CO2 22  GLUCOSE 200*  BUN 132*  CREATININE 6.64*  CALCIUM 8.1*   Liver Function Tests:  Recent Labs Lab 02/15/13 1908  AST 31  ALT 24  ALKPHOS 76  BILITOT 0.4  PROT 6.0  ALBUMIN 2.0*   No results found for this basename: LIPASE, AMYLASE,  in the last 168 hours No results found for this basename: AMMONIA,  in the last 168 hours CBC:  Recent Labs Lab 02/15/13 1908  WBC 13.5*  NEUTROABS 10.1*  HGB 11.9*  HCT 38.0*  MCV 97.4  PLT 235   Cardiac Enzymes:  Recent Labs Lab 02/15/13 1908  TROPONINI <0.30    CBG:  Recent Labs Lab 02/15/13 1804  GLUCAP 156*     Radiological Exams on Admission: Dg Chest 2 View  02/15/2013   CLINICAL DATA:  Shortness of breath  EXAM: CHEST  2 VIEW  COMPARISON:  None.  FINDINGS: Cardiac shadow is within normal limits. Diffuse interstitial changes are identified bilaterally. Would be difficult to exclude a component acute congestive failure. No focal confluent infiltrate is seen. No acute bony abnormality is noted.  IMPRESSION: Vascular congestion with mild interstitial changes.   Electronically Signed   By: Alcide Clever M.D.   On: 02/15/2013 18:25    Assessment/Plan Present on Admission:  . AKI (acute kidney injury) . COPD (chronic obstructive pulmonary disease) . CVA (cerebral vascular accident) . Essential hypertension, benign . Type II or unspecified type diabetes mellitus with neurological manifestations, not stated as uncontrolled(250.60) . Other dysphagia . CHF (congestive heart failure) . DNR (do not resuscitate)  PLAN: This gentleman has severe volume depletion with significant elevation of both BUN and Cr.   He has also hypovolemic hyponatremia.  Unfortunately, he also has mild vascular congestion on his CXR.  Clinically, he is not in frank heart failure, and would benefit getting IVF to perfuse his kidneys.  Will be cautious as he is at risk to go into CHF.  Will obtain renal US.  He doesn't have any evidence of having PNA, so will stop the VAN/ZOSYN, as it can be detrimental to his renal Fx.  I called his son, POA, and discussed the plan and the difficulty in treating patient with prerenal AKI with CHF at the same time.  He expressed understanding.  We also discuss code status, and he confirmed that his father is DNR.  Will comply.  He is currently stable, DNR, and will be admtted to [facility] service.  If his Cr doesn't respond as expected, please consult the renal service.  Thank you for allowing me  to participate in his care.  Other plans as per orders.  Code Status: DNR.  Houston Siren, MD. [facility] Pager [phone] 7pm to 7am.  02/15/2013, 10:00 PM

## 2013-02-15 NOTE — ED Notes (Signed)
Patient transported to X-ray 

## 2013-02-15 NOTE — ED Provider Notes (Signed)
CSN: [id number]     Arrival date & time 02/15/13  1730 History   First MD Initiated Contact with Patient 02/15/13 1732     Chief Complaint  Patient presents with  . Shortness of Breath   (Consider location/radiation/quality/duration/timing/severity/associated sxs/prior Treatment) Patient is a 72 y.o. male presenting with shortness of breath. The history is provided by the EMS personnel. The history is limited by the condition of the patient.  Shortness of Breath Severity:  Unable to specify Onset quality:  Unable to specify Timing:  Unable to specify Chronicity:  New Context comment:  Unknown Relieved by:  None tried Worsened by:  Nothing tried Ineffective treatments:  None tried Associated symptoms comment:  Unable to specify Risk factors: prolonged immobilization   Risk factors comment:  Unable to specify   Past Medical History  Diagnosis Date  . Hyperlipidemia   . Diabetes mellitus without complication   . Anemia   . Hypertension   . Stroke   . Arthritis   . Dysphagia   . Vitamin D deficiency   . Depressive disorder, not elsewhere classified   . Unspecified hereditary and idiopathic peripheral neuropathy   . 9 completed weeks of gestation   . Malignant hypertensive heart/renal dis   . COPD (chronic obstructive pulmonary disease)    History reviewed. No pertinent past surgical history. History reviewed. No pertinent family history. History  Substance Use Topics  . Smoking status: Unknown If Ever Smoked  . Smokeless tobacco: Not on file  . Alcohol Use: Not on file    Review of Systems  Unable to perform ROS: Patient nonverbal  Respiratory: Positive for shortness of breath.     Allergies  Review of patient's allergies indicates no known allergies.  Home Medications   No current outpatient prescriptions on file. BP 125/57  Pulse 81  Temp(Src) 100 F (37.8 C) (Rectal)  Resp 19  SpO2 94% Physical Exam  Constitutional: He is oriented to person, place,  and time. He appears well-developed and well-nourished. He is uncooperative. He has a sickly appearance. No distress. Face mask in place.  HENT:  Head: Normocephalic and atraumatic.  Eyes: Pupils are equal, round, and reactive to light.  Neck: Normal range of motion.  Cardiovascular: Normal rate and regular rhythm.   Pulmonary/Chest: No accessory muscle usage. Tachypnea noted. Not bradypneic. No respiratory distress. He has rhonchi. He has rales.  Abdominal: Soft. He exhibits no distension. There is no tenderness.  Musculoskeletal: Normal range of motion.  Neurological: He is oriented to person, place, and time.  Skin: Skin is warm. He is not diaphoretic.    ED Course  Procedures (including critical care time) Labs Review Labs Reviewed  CBC WITH DIFFERENTIAL - Abnormal; Notable for the following:    WBC 13.5 (*)    RBC 3.90 (*)    Hemoglobin 11.9 (*)    HCT 38.0 (*)    Neutro Abs 10.1 (*)    Monocytes Absolute 1.6 (*)    All other components within normal limits  COMPREHENSIVE METABOLIC PANEL - Abnormal; Notable for the following:    Sodium 155 (*)    Chloride 117 (*)    Glucose, Bld 200 (*)    BUN 132 (*)    Creatinine, Ser 6.64 (*)    Calcium 8.1 (*)    Albumin 2.0 (*)    GFR calc non Af Amer 7 (*)    GFR calc Af Amer 9 (*)    All other components within normal limits  PRO  B NATRIURETIC PEPTIDE - Abnormal; Notable for the following:    Pro B Natriuretic peptide (BNP) 987.2 (*)    All other components within normal limits  GLUCOSE, CAPILLARY - Abnormal; Notable for the following:    Glucose-Capillary 156 (*)    All other components within normal limits  URINALYSIS, ROUTINE W REFLEX MICROSCOPIC - Abnormal; Notable for the following:    APPearance TURBID (*)    Hgb urine dipstick TRACE (*)    Protein, ur 100 (*)    Leukocytes, UA SMALL (*)    All other components within normal limits  URINE MICROSCOPIC-ADD ON - Abnormal; Notable for the following:    Bacteria, UA FEW  (*)    Casts GRANULAR CAST (*)    All other components within normal limits  POCT I-STAT 3, BLOOD GAS (G3+) - Abnormal; Notable for the following:    pCO2 arterial 33.4 (*)    pO2, Arterial 47.0 (*)    Bicarbonate 19.4 (*)    Acid-base deficit 5.0 (*)    All other components within normal limits  URINE CULTURE  CULTURE, BLOOD (ROUTINE X 2)  CULTURE, BLOOD (ROUTINE X 2)  TROPONIN I   Imaging Review Dg Chest 2 View  02/15/2013   CLINICAL DATA:  Shortness of breath  EXAM: CHEST  2 VIEW  COMPARISON:  None.  FINDINGS: Cardiac shadow is within normal limits. Diffuse interstitial changes are identified bilaterally. Would be difficult to exclude a component acute congestive failure. No focal confluent infiltrate is seen. No acute bony abnormality is noted.  IMPRESSION: Vascular congestion with mild interstitial changes.   Electronically Signed   By: Alcide Clever M.D.   On: 02/15/2013 18:25    EKG Interpretation    Date/Time:  Monday February 15 2013 17:38:12 EST Ventricular Rate:  95 PR Interval:  136 QRS Duration: 61 QT Interval:  339 QTC Calculation: 426 R Axis:   8 Text Interpretation:  Sinus rhythm Atrial premature complexes in couplets Low voltage, precordial leads Posterior infarct, old Confirmed by HARRISON  MD, FORREST ([id number]) on 02/15/2013 5:45:08 PM            MDM   1. AKI (acute kidney injury)   2. CHF (congestive heart failure)   3. COPD (chronic obstructive pulmonary disease)   4. DNR (do not resuscitate)   5. Essential hypertension, benign     72 yo M with hx of HLD, HTN, stroke, who presents from SNF for respiratory distress, hypoxia (recorded in 70s), placed on CPAP by EMS.   Upon arrival here, patient on CPAP mask, in no acute distress. Mask taken off, and patient found to still be hypoxic (in low 80s), with improvement with [place]. Patient minimally interactive, which per EMS, is the patient's baseline. No concern for acute mental status changes. Will rule out  acute causes of shortness of breath.   Results as above, concerning for CHF exacerbation. Patient also hypernatremic, and with AKI (6.64 from baseline of 1.5). Contacted hospitalist for admission of the patient for continued workup for CHF and AKI. Will fluid hydrate judiciously given patient's volume overload status.   Patient admitted to the floor in stable condition.       Imagene Sheller, MD 02/15/13 2258

## 2013-02-15 NOTE — ED Notes (Signed)
Pt from [facility]. Staff noted pt having SOB and O2 sats in the 70's. Staff placed pt on NRB. Pt continued to have difficulty breathing and staff called [facility]. Pt found to be orthopneic and O2 sats in low 80's. Pt placed on CPAP. Pt's CBG 62 and D10 administered by EMS and brought CBG to 84. Pt is nonverbal at baseline. Pt recently diagnosed with bilateral lower lung PNA.

## 2013-02-16 ENCOUNTER — Inpatient Hospital Stay (HOSPITAL_COMMUNITY): Payer: Medicare Other

## 2013-02-16 LAB — HEMOGLOBIN A1C
Hgb A1c MFr Bld: 6.7 % — ABNORMAL HIGH (ref <5.7)
Mean Plasma Glucose: 146 mg/dL — ABNORMAL HIGH (ref <117)

## 2013-02-16 LAB — URINE CULTURE: Colony Count: NO GROWTH

## 2013-02-16 LAB — CBC
HCT: 36.2 % — ABNORMAL LOW (ref 39.0–52.0)
Hemoglobin: 11.4 g/dL — ABNORMAL LOW (ref 13.0–17.0)
MCH: 30.3 pg (ref 26.0–34.0)
MCHC: 31.5 g/dL (ref 30.0–36.0)
RDW: 14.8 % (ref 11.5–15.5)
WBC: 9.8 10*3/uL (ref 4.0–10.5)

## 2013-02-16 LAB — GLUCOSE, CAPILLARY
Glucose-Capillary: 137 mg/dL — ABNORMAL HIGH (ref 70–99)
Glucose-Capillary: 136 mg/dL — ABNORMAL HIGH (ref 70–99)

## 2013-02-16 LAB — BASIC METABOLIC PANEL
BUN: 133 mg/dL — ABNORMAL HIGH (ref 6–23)
Calcium: 7.7 mg/dL — ABNORMAL LOW (ref 8.4–10.5)
Creatinine, Ser: 6.62 mg/dL — ABNORMAL HIGH (ref 0.50–1.35)
GFR calc non Af Amer: 7 mL/min — ABNORMAL LOW (ref >90)
Glucose, Bld: 244 mg/dL — ABNORMAL HIGH (ref 70–99)
Potassium: 4.5 mEq/L (ref 3.5–5.1)
Sodium: 154 mEq/L — ABNORMAL HIGH (ref 135–145)

## 2013-02-16 MED ORDER — INSULIN ASPART 100 UNIT/ML ~~LOC~~ SOLN: 0.0000 [IU] | Freq: Every day | SUBCUTANEOUS | Status: DC

## 2013-02-16 MED ORDER — BIOTENE DRY MOUTH MT LIQD
15.0000 mL | Freq: Two times a day (BID) | OROMUCOSAL | Status: DC
  Administered 2013-02-16 – 2013-03-03 (×32): 15 mL via OROMUCOSAL

## 2013-02-16 MED ORDER — INSULIN ASPART 100 UNIT/ML ~~LOC~~ SOLN
0.0000 [IU] | Freq: Three times a day (TID) | SUBCUTANEOUS | Status: DC
  Administered 2013-02-17: 2 [IU] via SUBCUTANEOUS

## 2013-02-16 MED ORDER — CHLORHEXIDINE GLUCONATE CLOTH 2 % EX PADS
6.0000 | MEDICATED_PAD | Freq: Every day | CUTANEOUS | Status: AC
  Administered 2013-02-16 – 2013-02-20 (×5): 6 via TOPICAL

## 2013-02-16 MED ORDER — INSULIN ASPART 100 UNIT/ML ~~LOC~~ SOLN: 4.0000 [IU] | Freq: Three times a day (TID) | SUBCUTANEOUS | Status: DC

## 2013-02-16 MED ORDER — ALBUTEROL SULFATE (5 MG/ML) 0.5% IN NEBU
2.5000 mg | INHALATION_SOLUTION | Freq: Three times a day (TID) | RESPIRATORY_TRACT | Status: DC
  Administered 2013-02-16 – 2013-02-17 (×2): 2.5 mg via RESPIRATORY_TRACT
  Filled 2013-02-16 (×2): qty 0.5

## 2013-02-16 MED ORDER — FUROSEMIDE 10 MG/ML IJ SOLN: 40.0000 mg | Freq: Once | INTRAMUSCULAR | Status: DC

## 2013-02-16 MED ORDER — METOPROLOL TARTRATE 1 MG/ML IV SOLN
5.0000 mg | Freq: Four times a day (QID) | INTRAVENOUS | Status: DC
  Administered 2013-02-16 – 2013-02-19 (×12): 5 mg via INTRAVENOUS
  Filled 2013-02-16 (×19): qty 5

## 2013-02-16 MED ORDER — MUPIROCIN 2 % EX OINT
1.0000 "application " | TOPICAL_OINTMENT | Freq: Two times a day (BID) | CUTANEOUS | Status: AC
  Administered 2013-02-16 – 2013-02-20 (×9): 1 via NASAL
  Filled 2013-02-16 (×3): qty 22

## 2013-02-16 MED ORDER — ALBUTEROL SULFATE (5 MG/ML) 0.5% IN NEBU: 2.5000 mg | INHALATION_SOLUTION | RESPIRATORY_TRACT | Status: DC | PRN

## 2013-02-16 MED ORDER — ALBUTEROL SULFATE (5 MG/ML) 0.5% IN NEBU
2.5000 mg | INHALATION_SOLUTION | Freq: Four times a day (QID) | RESPIRATORY_TRACT | Status: DC
  Administered 2013-02-16 (×3): 2.5 mg via RESPIRATORY_TRACT
  Filled 2013-02-16 (×3): qty 0.5

## 2013-02-16 NOTE — Progress Notes (Signed)
INITIAL NUTRITION ASSESSMENT  DOCUMENTATION CODES Per approved criteria  -Not Applicable   INTERVENTION: -Modify diet texture per SLP recommendations -Monitor PO intake and supplement as warranted   NUTRITION DIAGNOSIS: Inadequate oral intake related to difficulty swallowing as evidenced by PO intake < 75% est nutritional needs.   Goal: Pt to meet >/= 90% of their estimated nutrition needs   Monitor:  Pt to meet >/= 90% of their estimated nutrition needs   Reason for Assessment: Low Braden Score    72 y.o. male  Admitting Dx: AKI (acute kidney injury)  ASSESSMENT: Eric Graves is an 72 y.o. male with hx of HTN, CVA with left side hemiplegia, dysphagia requiring nectar thickened food, HTN, hx of COPD, brought to the ER as he was having increase shortness of breath. He does not communicate and denied he was having shortness of breath. Evalaution in the ER showed CXR with mild vascular congestion, AKI with Cr of 6.64 and BUN of 132. His baseline Cr was 1.7. He has a WBC of 13K, a Na of 155, and normal LFTs. Hospitalist was asked to admit him for AKI. He was started on VAN/Zosyn for ? PNA.  -Per discussion with pt's nurse, pt was unable to tolerate Dysphagia 2 diet texture at breakfast. Appeared to be at high risk of aspiration d/t lung sounds per RN. Had <25% PO intake -Was undergoing Barium swallow study during time of assessment. If pt able to tolerate, will monitor PO intake and supplement as warranted. Consider initiation within 2-3 days if pt unable to pass -Pt resident of SNF. Was on dysphagia 2 diet per RN -Has appeared to have lost approximately 20 lbs in past 6 months per previous medical records; however, pt was admitted with dehydration and volume depletion which may have attributed to weight fluctations   Height: Ht Readings from Last 1 Encounters:  02/15/13 5\' 5"  (1.651 m)    Weight: Wt Readings from Last 1 Encounters:  02/16/13 145 lb 4.5 oz (65.9 kg)     Ideal Body Weight: 136 lbs/61.8kg  % Ideal Body Weight: 107%  Wt Readings from Last 10 Encounters:  02/16/13 145 lb 4.5 oz (65.9 kg)  09/01/12 166 lb (75.297 kg)  08/18/12 163 lb (73.936 kg)  07/21/12 159 lb (72.122 kg)  05/26/12 159 lb (72.122 kg)    Usual Body Weight: unable to determine  % Usual Body Weight: unable to determine  BMI:  Body mass index is 24.18 kg/(m^2).  Estimated Nutritional Needs: Kcal: 1700-1900 Protein: 65- 80 gram Fluid: 1000 ml- modify as urine output increases  Skin: WNL  Diet Order: Dysphagia 2/pudding thick liquids  EDUCATION NEEDS: -No education needs identified at this time   Intake/Output Summary (Last 24 hours) at 02/16/13 1135 Last data filed at 02/16/13 1126  Gross per 24 hour  Intake    220 ml  Output    500 ml  Net   -280 ml    Last BM: 12/22-loose  Labs:   Recent Labs Lab 02/15/13 1908 02/16/13 0450  NA 155* 154*  K 4.4 4.5  CL 117* 118*  CO2 22 20  BUN 132* 133*  CREATININE 6.64* 6.62*  CALCIUM 8.1* 7.7*  GLUCOSE 200* 244*    CBG (last 3)   Recent Labs  02/15/13 2343 02/16/13 0401 02/16/13 0801  GLUCAP 209* 236* 150*    Scheduled Meds: . albuterol  2.5 mg Nebulization Q6H  . antiseptic oral rinse  15 mL Mouth Rinse BID  . atorvastatin  20 mg Oral Daily  . carvedilol  6.25 mg Oral BID WC  . cefTRIAXone (ROCEPHIN)  IV  1 g Intravenous Q24H  . Chlorhexidine Gluconate Cloth  6 each Topical Q0600  . clopidogrel  75 mg Oral Q breakfast  . gabapentin  400 mg Oral Daily  . heparin  5,000 Units Subcutaneous Q8H  . insulin aspart  0-15 Units Subcutaneous TID WC  . insulin aspart  0-5 Units Subcutaneous QHS  . insulin aspart  4 Units Subcutaneous TID WC  . insulin glargine  20 Units Subcutaneous QHS  . mupirocin ointment  1 application Nasal BID  . sodium chloride  3 mL Intravenous Q12H    Continuous Infusions: . dextrose 5 % and 0.9% NaCl 75 mL/hr at 02/15/13 2336    Past Medical History   Diagnosis Date  . Hyperlipidemia   . Diabetes mellitus without complication   . Anemia   . Hypertension   . Stroke   . Arthritis   . Dysphagia   . Vitamin D deficiency   . Depressive disorder, not elsewhere classified   . Unspecified hereditary and idiopathic peripheral neuropathy   . 9 completed weeks of gestation   . Malignant hypertensive heart/renal dis   . COPD (chronic obstructive pulmonary disease)     History reviewed. No pertinent past surgical history.  Lloyd Huger MS RD LDN Clinical Dietitian [phone]

## 2013-02-16 NOTE — Progress Notes (Signed)
The patient arrived to 4E27 from the ED at 2300.  The patient was placed on telemetry.  VS were taken and the patient was assessed.  He was placed on contact precautions for rule-out MRSA.  He is aphasic and does not readily respond with head movement to yes/no questions.  He is also drowsy, but easily arousable.  The call bell was placed within reach and the bed alarm was turned on.

## 2013-02-16 NOTE — Progress Notes (Signed)
Utilization review completed. Yacine Droz, RN, BSN. 

## 2013-02-16 NOTE — Progress Notes (Addendum)
Patient seen and examined. Admitted this morning with acute renal failure in the setting of stage III chronic kidney disease with baseline creatinine of 1.7 and pneumonia. History of CVA with left-sided hemiplegia, chronic dysphasia. On exam, the patient has shallow breathing and rales, rhonchi throughout his lung fields. His condition is guarded. He is a DO NOT RESUSCITATE. Continue supportive care with IV antibiotics, gentle IV fluids, and follow. Of note, the patient failed a swallowing evaluation and all by mouth medications were discontinued. Will need a palliative care consultation or consultation with the family if he remains with severe dysphasia, but his overall prognosis seems to be poor.  Corgan Mormile 02/16/2013 5:05 PM

## 2013-02-16 NOTE — Evaluation (Signed)
Clinical/Bedside Swallow Evaluation Patient Details  Name: Eric Graves MRN: [id number] Date of Birth: 1940/04/09  Today's Date: 02/16/2013 Time: 4098-1191 SLP Time Calculation (min): 34 min  Past Medical History:  Past Medical History  Diagnosis Date  . Hyperlipidemia   . Diabetes mellitus without complication   . Anemia   . Hypertension   . Stroke   . Arthritis   . Dysphagia   . Vitamin D deficiency   . Depressive disorder, not elsewhere classified   . Unspecified hereditary and idiopathic peripheral neuropathy   . 9 completed weeks of gestation   . Malignant hypertensive heart/renal dis   . COPD (chronic obstructive pulmonary disease)    Past Surgical History: History reviewed. No pertinent past surgical history. HPI:  Eric Graves is an 72 y.o. male with hx of HTN, CVA with left side hemiplegia, dysphagia requiring nectar thickened food, HTN, hx of COPD, brought to the ER as he was having increase shortness of breath.  He does not communicate and denied he was having shortness of breath.   Assessment / Plan / Recommendation Clinical Impression  Pt with a h/o chronic dysphagia on nectar thick liquids presents with audible congestion at baseline with immediate coughing after cup sips of both nectar and honey-thick liquids. No overt s/s of aspiration were observed with solid consistencies, however pt does present with prolonged mastication/oral transit and oral residuals with small bites of scrambled eggs. Due to high aspiration risk, recommend to downgrade diet to Dys 2 textures and pudding-thick liquids until objective testing can be completed later today.    Aspiration Risk  Severe    Diet Recommendation Dysphagia 2 (Fine chop);Pudding-thick liquid   Liquid Administration via: Cup;No straw;Spoon Medication Administration: Crushed with puree Supervision: Full supervision/cueing for compensatory strategies;Staff to assist with self feeding Compensations: Slow rate;Small  sips/bites;Check for pocketing Postural Changes and/or Swallow Maneuvers: Seated upright 90 degrees;Upright 30-60 min after meal    Other  Recommendations Recommended Consults: MBS Oral Care Recommendations: Oral care BID Other Recommendations: Order thickener from pharmacy   Follow Up Recommendations  Skilled Nursing facility    Frequency and Duration        Pertinent Vitals/Pain N/A    SLP Swallow Goals  TBD pending objective testing.   Swallow Study Prior Functional Status       General Date of Onset:  (chronic) HPI: Eric Graves is an 72 y.o. male with hx of HTN, CVA with left side hemiplegia, dysphagia requiring nectar thickened food, HTN, hx of COPD, brought to the ER as he was having increase shortness of breath.  He does not communicate and denied he was having shortness of breath. Type of Study: Bedside swallow evaluation Previous Swallow Assessment: OP MBS 09/26/11 with recommendations fo Dys 3 and nectar thick liquids with consideration for water protocol Diet Prior to this Study: Regular;Nectar-thick liquids Temperature Spikes Noted: No Respiratory Status: Nasal cannula (2L) History of Recent Intubation: No Behavior/Cognition: Alert;Requires cueing;Doesn't follow directions Oral Cavity - Dentition: Missing dentition Self-Feeding Abilities: Needs assist Patient Positioning: Upright in bed Baseline Vocal Quality: Clear Volitional Cough: Cognitively unable to elicit Volitional Swallow: Unable to elicit    Oral/Motor/Sensory Function Overall Oral Motor/Sensory Function: Other (comment) (limited assessment due to decreased ability to follow commands) Lingual Symmetry: Within Functional Limits Facial Symmetry: Within Functional Limits Mandible: Within Functional Limits   Ice Chips Ice chips: Not tested   Thin Liquid Thin Liquid: Not tested    Nectar Thick Nectar Thick Liquid: Impaired Presentation: Cup;Self Fed Oral  Phase Impairments: Impaired anterior to  posterior transit Oral phase functional implications: Prolonged oral transit Pharyngeal Phase Impairments: Suspected delayed Swallow;Cough - Immediate   Honey Thick Honey Thick Liquid: Impaired Presentation: Cup;Self fed Oral Phase Impairments: Impaired anterior to posterior transit Oral Phase Functional Implications: Prolonged oral transit Pharyngeal Phase Impairments: Suspected delayed Swallow;Cough - Immediate   Puree Puree: Impaired Presentation: Spoon;Self Fed Oral Phase Impairments: Impaired anterior to posterior transit Oral Phase Functional Implications: Prolonged oral transit Pharyngeal Phase Impairments: Suspected delayed Swallow   Solid   GO    Solid: Impaired Presentation: Spoon Oral Phase Impairments: Impaired anterior to posterior transit Oral Phase Functional Implications: Other (comment);Oral residue (significantly prolonged mastication) Pharyngeal Phase Impairments: Suspected delayed Swallow        Eric Graves, M.A. CCC-SLP [phone]  Eric Graves 02/16/2013,12:10 PM

## 2013-02-16 NOTE — Procedures (Signed)
Objective Swallowing Evaluation: Modified Barium Swallowing Study  Patient Details  Name: Eric Graves MRN: [id number] Date of Birth: November 19, 1940  Today's Date: 02/16/2013 Time: 1200-1230 SLP Time Calculation (min): 30 min  Past Medical History:  Past Medical History  Diagnosis Date  . Hyperlipidemia   . Diabetes mellitus without complication   . Anemia   . Hypertension   . Stroke   . Arthritis   . Dysphagia   . Vitamin D deficiency   . Depressive disorder, not elsewhere classified   . Unspecified hereditary and idiopathic peripheral neuropathy   . 9 completed weeks of gestation   . Malignant hypertensive heart/renal dis   . COPD (chronic obstructive pulmonary disease)    Past Surgical History: History reviewed. No pertinent past surgical history. HPI:  Eric Graves is an 72 y.o. male with hx of HTN, CVA with left side hemiplegia, dysphagia requiring nectar thickened food, HTN, hx of COPD, brought to the ER as he was having increase shortness of breath.  He does not communicate, but denied he was having shortness of breath.     Assessment / Plan / Recommendation Clinical Impression  Dysphagia Diagnosis: Severe oral phase dysphagia;Severe pharyngeal phase dysphagia Clinical impression: Prior to start of MBS, pt was noted to exhibit congested breath sounds.  No vocalization elicited.  Pt exhibits severe oropharyngeal sensory and motor based dysphagia.  Initial 1/2 tsp boluses elicited no swallow reflex.  Pt was noted to hold bolus orally with absent formation or posterior propulsion. Presentation of nectar thick liquid bolus resulted in spillage to pyriform sinuses, and penetration and aspiration during the swallow with no cough response.   Pt is unable to follow directions for airway protection.  Remaining oral residue was suctioned from pt mouth.  Pt is NOT SAFE for po intake at this time.    Treatment Recommendation  Therapy as outlined in treatment plan below    Diet  Recommendation NPO   Liquid Administration via: Cup;No straw;Spoon Medication Administration: Via alternative means Supervision: Full supervision/cueing for compensatory strategies;Staff to assist with self feeding Compensations: Slow rate;Small sips/bites;Check for pocketing Postural Changes and/or Swallow Maneuvers: Seated upright 90 degrees;Upright 30-60 min after meal    Other  Recommendations Recommended Consults: MBS Oral Care Recommendations: Oral care Q4 per protocol Other Recommendations: Order thickener from pharmacy   Follow Up Recommendations  24 hour supervision/assistance;Skilled Nursing facility    Frequency and Duration min 1 x/week  1 week   Pertinent Vitals/Pain VSS, no pain indicated    SLP Swallow Goals  see care plan   General Date of Onset:  (chronic) HPI: Eric Graves is an 72 y.o. male with hx of HTN, CVA with left side hemiplegia, dysphagia requiring nectar thickened food, HTN, hx of COPD, brought to the ER as he was having increase shortness of breath.  He does not communicate, but denied he was having shortness of breath. Type of Study: Modified Barium Swallowing Study Reason for Referral: Objectively evaluate swallowing function Previous Swallow Assessment: BSE 02/16/13; OP MBS 09/26/11 with recommendations fo Dys 3 and nectar thick liquids with consideration for water protocol Diet Prior to this Study: Regular;Nectar-thick liquids Temperature Spikes Noted: No Respiratory Status: Nasal cannula History of Recent Intubation: No Behavior/Cognition: Alert;Requires cueing;Doesn't follow directions Oral Cavity - Dentition: Missing dentition Oral Motor / Sensory Function: Impaired - see Bedside swallow eval Self-Feeding Abilities: Total assist Patient Positioning: Upright in chair Baseline Vocal Quality: Clear Volitional Cough: Cognitively unable to elicit Volitional Swallow: Unable to elicit Anatomy: Within  functional limits Pharyngeal Secretions: Not  observed secondary MBS    Reason for Referral Objectively evaluate swallowing function   Oral Phase Oral Preparation/Oral Phase Oral Phase: Impaired Oral - Nectar Oral - Nectar Teaspoon: Weak lingual manipulation;Lingual pumping;Holding of bolus;Reduced posterior propulsion Oral - Nectar Straw: Weak lingual manipulation;Lingual pumping;Holding of bolus;Reduced posterior propulsion;Piecemeal swallowing Oral - Solids Oral - Puree: Weak lingual manipulation;Lingual pumping;Incomplete tongue to palate contact;Reduced posterior propulsion;Holding of bolus;Piecemeal swallowing   Pharyngeal Phase Pharyngeal Phase Pharyngeal Phase: Impaired Pharyngeal - Nectar Pharyngeal - Nectar Teaspoon:  (ABSENT SWALLOW) Pharyngeal - Nectar Straw: Penetration/Aspiration during swallow;Premature spillage to pyriform sinuses;Reduced pharyngeal peristalsis;Reduced epiglottic inversion;Reduced laryngeal elevation;Reduced airway/laryngeal closure;Moderate aspiration;Reduced tongue base retraction;Pharyngeal residue - valleculae;Pharyngeal residue - posterior pharnyx Penetration/Aspiration details (nectar straw): Material enters airway, passes BELOW cords without attempt by patient to eject out (silent aspiration) Pharyngeal - Solids Pharyngeal - Puree:  (ABSENT SWALLOW) Pharyngeal Phase - Comment Pharyngeal Comment: Puree consistency suctioned from oral cavity  Cervical Esophageal Phase    GO  Eric Graves [facility], CCC-SLP [phone]   Cervical Esophageal Phase Cervical Esophageal Phase: [facility]         Eric Graves 02/16/2013, 12:58 PM

## 2013-02-16 NOTE — ED Provider Notes (Signed)
Medical screening examination/treatment/procedure(s) were conducted as a shared visit with resident physician and myself.  I personally evaluated the patient during the encounter.   EKG Interpretation    Date/Time:  Monday February 15 2013 17:38:12 EST Ventricular Rate:  95 PR Interval:  136 QRS Duration: 61 QT Interval:  339 QTC Calculation: 426 R Axis:   8 Text Interpretation:  Sinus rhythm Atrial premature complexes in couplets Low voltage, precordial leads Posterior infarct, old Confirmed by Ciel Chervenak  MD, Jaramie Bastos ([id number]) on 02/15/2013 5:45:08 PM            I examined the patient. Level 5 caveat due to altered mental status and sob.  Pt has rhonchi and rales on exam. Cardiac exam wnl. Abdomen soft. He was placed on bipap en route which was changed to a  upon arrival. WOB has improved per EMS. Pt found to be in ARF, hypernatremic, w/ acute respiratory failure. Will admit to hospitalist.   Junius Argyle, MD 02/16/13 1124

## 2013-02-17 DIAGNOSIS — E1149 Type 2 diabetes mellitus with other diabetic neurological complication: Secondary | ICD-10-CM

## 2013-02-17 DIAGNOSIS — R1310 Dysphagia, unspecified: Secondary | ICD-10-CM | POA: Diagnosis present

## 2013-02-17 DIAGNOSIS — E870 Hyperosmolality and hypernatremia: Secondary | ICD-10-CM

## 2013-02-17 DIAGNOSIS — N179 Acute kidney failure, unspecified: Secondary | ICD-10-CM | POA: Diagnosis present

## 2013-02-17 DIAGNOSIS — N189 Chronic kidney disease, unspecified: Secondary | ICD-10-CM

## 2013-02-17 LAB — RENAL FUNCTION PANEL
Albumin: 1.8 g/dL — ABNORMAL LOW (ref 3.5–5.2)
BUN: 122 mg/dL — ABNORMAL HIGH (ref 6–23)
CO2: 18 mEq/L — ABNORMAL LOW (ref 19–32)
Calcium: 7.6 mg/dL — ABNORMAL LOW (ref 8.4–10.5)
Chloride: 128 mEq/L — ABNORMAL HIGH (ref 96–112)
Creatinine, Ser: 6.2 mg/dL — ABNORMAL HIGH (ref 0.50–1.35)
GFR calc Af Amer: 9 mL/min — ABNORMAL LOW (ref >90)
GFR calc non Af Amer: 8 mL/min — ABNORMAL LOW (ref >90)
Phosphorus: 4.3 mg/dL (ref 2.3–4.6)
Sodium: 163 mEq/L (ref 135–145)

## 2013-02-17 LAB — BASIC METABOLIC PANEL
BUN: 111 mg/dL — ABNORMAL HIGH (ref 6–23)
CO2: 19 mEq/L (ref 19–32)
Calcium: 7.5 mg/dL — ABNORMAL LOW (ref 8.4–10.5)
Creatinine, Ser: 5.68 mg/dL — ABNORMAL HIGH (ref 0.50–1.35)
GFR calc non Af Amer: 9 mL/min — ABNORMAL LOW (ref >90)
Glucose, Bld: 143 mg/dL — ABNORMAL HIGH (ref 70–99)
Sodium: 158 mEq/L — ABNORMAL HIGH (ref 135–145)

## 2013-02-17 LAB — CBC
HCT: 33.3 % — ABNORMAL LOW (ref 39.0–52.0)
Hemoglobin: 10.6 g/dL — ABNORMAL LOW (ref 13.0–17.0)
MCH: 30.2 pg (ref 26.0–34.0)
MCV: 94.9 fL (ref 78.0–100.0)
Platelets: 298 10*3/uL (ref 150–400)
RBC: 3.51 MIL/uL — ABNORMAL LOW (ref 4.22–5.81)
RDW: 14.9 % (ref 11.5–15.5)
WBC: 7.7 10*3/uL (ref 4.0–10.5)

## 2013-02-17 LAB — GLUCOSE, CAPILLARY
Glucose-Capillary: 130 mg/dL — ABNORMAL HIGH (ref 70–99)
Glucose-Capillary: 114 mg/dL — ABNORMAL HIGH (ref 70–99)

## 2013-02-17 MED ORDER — INSULIN ASPART 100 UNIT/ML ~~LOC~~ SOLN
0.0000 [IU] | SUBCUTANEOUS | Status: DC
  Administered 2013-02-17 – 2013-02-18 (×5): 1 [IU] via SUBCUTANEOUS
  Administered 2013-02-18 – 2013-02-19 (×3): 2 [IU] via SUBCUTANEOUS
  Administered 2013-02-19: 12:00:00 3 [IU] via SUBCUTANEOUS
  Administered 2013-02-19: 09:00:00 2 [IU] via SUBCUTANEOUS
  Administered 2013-02-19: 04:00:00 1 [IU] via SUBCUTANEOUS

## 2013-02-17 MED ORDER — DEXTROSE 5 % IV SOLN
INTRAVENOUS | Status: DC
  Administered 2013-02-17: 08:00:00 via INTRAVENOUS
  Administered 2013-02-19: 1000 mL via INTRAVENOUS
  Administered 2013-02-19 (×2): via INTRAVENOUS

## 2013-02-17 MED ORDER — INSULIN GLARGINE 100 UNIT/ML ~~LOC~~ SOLN
15.0000 [IU] | Freq: Every day | SUBCUTANEOUS | Status: DC
  Administered 2013-02-17 – 2013-02-25 (×9): 15 [IU] via SUBCUTANEOUS
  Filled 2013-02-17 (×11): qty 0.15

## 2013-02-17 NOTE — Consult Note (Signed)
Patient ZO:XWRUEA Lansdowne      DOB: 08-25-1940      VWU:[id number]   Summary of Goals of Care; full not to follow:  Met with patient's two sons Casimiro Needle, and Hank.  Patient not able to fully participate in goals of care due to previous stroke unable to speak but can gesture and point.  Updated sons on acute on chronic renal disease likely secondary to dehydration.  Updated sons on tenuous nature of trying to balance hydration with CHF.  Family reports patient with limited quality of life over the last year.  Patient essentially bed bound, not verbal with modified diet due to history of CVA.  Initiated some conversation about support options if patient continues to have trouble with aspiration etc.  Sons not able to actively participate in setting goals.  Casimiro Needle is HPOA and states it would be up to a "flip of a coin".  We understood this to mean that he would need to make decisions in the moment.  Patient has brothers and sisters who are vested in his interests.  His sister had asked about his renal function while on facetime with patient.  Goals at this time are as follow:  Continue to try to correct Na, pulmonary edema and renal function.  If patient decompensates from pulmonary status offer bipap, we discussed what this will look like for the patient.  Sons would then engage in decision making .  We discussed what comfort care may look like if he has worsening respiratory failure.   They seem to understand that we are at a crossroads in his care and they could be receiving a call with worsened symptoms.  They remain non committal about comfort care as a part of his plan at this time but are open to discuss this as symptoms warrant.  Recommend:  1.  DNR/ DNI   Would offer family bipap if he decompensates vs comfort care.  2.  Aspiration:  Known history had been on nectar thick liquids.  I have attempted to have speech come to reevaluate him as his family reports he is more awake and alert than yesterday  when they evaluated him and he is asking to drink.  3.  Acute renal failure: per renal and primary service  4.  Pneumonia: continue antibiotics for now.  Patient completed 4 doses of tamiflu.  The facility he was in was on restriction because of flu.     Prognosis guarded.  Overall , if aspirating could have 6 months or less prognosis if can't correct current issues.   Total time: 303 pm- 430 pm  [employer] given.  Kazimierz Springborn L. Ladona Ridgel, MD MBA The Palliative Medicine Team at [facility] Phone: [phone] Pager: [phone]

## 2013-02-17 NOTE — Consult Note (Signed)
[facility] Renal Consultation Note  Requesting MD: Hongalgi Indication for Consultation: Acute on chronic renal failure in the setting of volume depletion   HPI:  Eric Graves is a 72 y.o. male With past medical history significant for hypertension, severe CVA with left-sided hemiplegia, dysphasia requiring nectar thickened food. He also has a history of COPD. I suspect he previously resided at a skilled nursing facility. He was a DO NOT RESUSCITATE predating this hospitalization which has been confirmed. He was brought in with "complaints" Of shortness of breath. I put complaints" because he is not communicative. Evaluation in the emergency department creatinine was found to be 6.64 with a BUN of 132. History he has a baseline creatinine of 1.7 but I do not have a record of when that was. He also had a sodium of 155. Cautious IV fluids were given secondary to findings on chest x-ray. He has been making urine up to 800 cc today. His creatinine has improved only slightly from 6.6---> 6.2. Sodium has worsened. He has continued to fail swallowing evaluations and is likely aspirating. Discussions are being held regarding goals of care. We are asked to see him for his renal failure. Today in response to worsening sodium, fluid was changed to D5 and is currently running at 75 cc per hour. Patient is complaining of thirst but no nausea.  Creatinine, Ser  Date/Time Value Range Status  02/17/2013  5:43 AM 6.20* 0.50 - 1.35 mg/dL Final  16/11/9602  5:40 AM 6.62* 0.50 - 1.35 mg/dL Final  98/12/9145  8:29 PM 6.64* 0.50 - 1.35 mg/dL Final     PMHx:   Past Medical History  Diagnosis Date  . Hyperlipidemia   . Diabetes mellitus without complication   . Anemia   . Hypertension   . Stroke   . Arthritis   . Dysphagia   . Vitamin D deficiency   . Depressive disorder, not elsewhere classified   . Unspecified hereditary and idiopathic peripheral neuropathy   . 9 completed weeks of  gestation   . Malignant hypertensive heart/renal dis   . COPD (chronic obstructive pulmonary disease)     History reviewed. No pertinent past surgical history.  Family Hx: History reviewed. No pertinent family history.  Social History:  has no tobacco, alcohol, and drug history on file.  Allergies: No Known Allergies  Medications: Prior to Admission medications   Medication Sig Start Date End Date Taking? Authorizing Provider  atorvastatin (LIPITOR) 20 MG tablet Take 20 mg by mouth daily.   Yes Historical Provider, MD  carvedilol (COREG) 6.25 MG tablet Take 6.25 mg by mouth 2 (two) times daily with a meal.   Yes Historical Provider, MD  clopidogrel (PLAVIX) 75 MG tablet Take 75 mg by mouth daily.   Yes Historical Provider, MD  gabapentin (NEURONTIN) 400 MG capsule Take 400 mg by mouth daily.   Yes Historical Provider, MD  insulin aspart (NOVOLOG) 100 UNIT/ML injection Inject 5 Units into the skin 3 (three) times daily before meals. 5 units prior to meals for cbg >= 150   Yes Historical Provider, MD  insulin glargine (LANTUS) 100 UNIT/ML injection Inject 20 Units into the skin at bedtime.   Yes Historical Provider, MD  ipratropium-albuterol (DUONEB) 0.5-2.5 (3) MG/3ML SOLN Take 3 mLs by nebulization every 6 (six) hours as needed (for breathing).   Yes Historical Provider, MD  oseltamivir (TAMIFLU) 75 MG capsule Take 75 mg by mouth daily. For 14 days (started 02/12/13)   Yes Historical Provider, MD  I have reviewed the patient's current medications.  Labs:  Results for orders placed during the hospital encounter of 02/15/13 (from the past 48 hour(s))  POCT I-STAT 3, BLOOD GAS (G3+)     Status: Abnormal   Collection Time    02/15/13  5:42 PM      Result Value Range   pH, Arterial 7.372  7.350 - 7.450   pCO2 arterial 33.4 (*) 35.0 - 45.0 mmHg   pO2, Arterial 47.0 (*) 80.0 - 100.0 mmHg   Bicarbonate 19.4 (*) 20.0 - 24.0 mEq/L   TCO2 20  0 - 100 mmol/L   O2 Saturation 82.0      Acid-base deficit 5.0 (*) 0.0 - 2.0 mmol/L   Collection site RADIAL, ALLEN'S TEST ACCEPTABLE     Drawn by Operator     Sample type ARTERIAL    GLUCOSE, CAPILLARY     Status: Abnormal   Collection Time    02/15/13  6:04 PM      Result Value Range   Glucose-Capillary 156 (*) 70 - 99 mg/dL  CBC WITH DIFFERENTIAL     Status: Abnormal   Collection Time    02/15/13  7:08 PM      Result Value Range   WBC 13.5 (*) 4.0 - 10.5 K/uL   RBC 3.90 (*) 4.22 - 5.81 MIL/uL   Hemoglobin 11.9 (*) 13.0 - 17.0 g/dL   HCT 40.9 (*) 81.1 - 91.4 %   MCV 97.4  78.0 - 100.0 fL   MCH 30.5  26.0 - 34.0 pg   MCHC 31.3  30.0 - 36.0 g/dL   RDW 78.2  95.6 - 21.3 %   Platelets 235  150 - 400 K/uL   Neutrophils Relative % 75  43 - 77 %   Neutro Abs 10.1 (*) 1.7 - 7.7 K/uL   Lymphocytes Relative 14  12 - 46 %   Lymphs Abs 1.8  0.7 - 4.0 K/uL   Monocytes Relative 12  3 - 12 %   Monocytes Absolute 1.6 (*) 0.1 - 1.0 K/uL   Eosinophils Relative 0  0 - 5 %   Eosinophils Absolute 0.0  0.0 - 0.7 K/uL   Basophils Relative 0  0 - 1 %   Basophils Absolute 0.0  0.0 - 0.1 K/uL  COMPREHENSIVE METABOLIC PANEL     Status: Abnormal   Collection Time    02/15/13  7:08 PM      Result Value Range   Sodium 155 (*) 135 - 145 mEq/L   Potassium 4.4  3.5 - 5.1 mEq/L   Chloride 117 (*) 96 - 112 mEq/L   CO2 22  19 - 32 mEq/L   Glucose, Bld 200 (*) 70 - 99 mg/dL   BUN 086 (*) 6 - 23 mg/dL   Creatinine, Ser 5.78 (*) 0.50 - 1.35 mg/dL   Calcium 8.1 (*) 8.4 - 10.5 mg/dL   Total Protein 6.0  6.0 - 8.3 g/dL   Albumin 2.0 (*) 3.5 - 5.2 g/dL   AST 31  0 - 37 U/L   ALT 24  0 - 53 U/L   Alkaline Phosphatase 76  39 - 117 U/L   Total Bilirubin 0.4  0.3 - 1.2 mg/dL   GFR calc non Af Amer 7 (*) >90 mL/min   GFR calc Af Amer 9 (*) >90 mL/min   Comment: (NOTE)     The eGFR has been calculated using the CKD EPI equation.     This calculation has  not been validated in all clinical situations.     eGFR's persistently <90 mL/min signify  possible Chronic Kidney     Disease.  TROPONIN I     Status: None   Collection Time    02/15/13  7:08 PM      Result Value Range   Troponin I <0.30  <0.30 ng/mL   Comment:            Due to the release kinetics of cTnI,     a negative result within the first hours     of the onset of symptoms does not rule out     myocardial infarction with certainty.     If myocardial infarction is still suspected,     repeat the test at appropriate intervals.  PRO B NATRIURETIC PEPTIDE     Status: Abnormal   Collection Time    02/15/13  7:08 PM      Result Value Range   Pro B Natriuretic peptide (BNP) 987.2 (*) 0 - 125 pg/mL  URINE CULTURE     Status: None   Collection Time    02/15/13  8:30 PM      Result Value Range   Specimen Description URINE, CATHETERIZED     Special Requests NONE     Culture  Setup Time       Value: 02/16/2013 01:51     Performed at [facility] Count       Value: NO GROWTH     Performed at [facility]   Culture       Value: NO GROWTH     Performed at [facility]   Report Status 02/16/2013 FINAL    URINALYSIS, ROUTINE W REFLEX MICROSCOPIC     Status: Abnormal   Collection Time    02/15/13  8:30 PM      Result Value Range   Color, Urine YELLOW  YELLOW   APPearance TURBID (*) CLEAR   Specific Gravity, Urine 1.019  1.005 - 1.030   pH 5.0  5.0 - 8.0   Glucose, UA NEGATIVE  NEGATIVE mg/dL   Hgb urine dipstick TRACE (*) NEGATIVE   Bilirubin Urine NEGATIVE  NEGATIVE   Ketones, ur NEGATIVE  NEGATIVE mg/dL   Protein, ur 409 (*) NEGATIVE mg/dL   Urobilinogen, UA 0.2  0.0 - 1.0 mg/dL   Nitrite NEGATIVE  NEGATIVE   Leukocytes, UA SMALL (*) NEGATIVE  URINE MICROSCOPIC-ADD ON     Status: Abnormal   Collection Time    02/15/13  8:30 PM      Result Value Range   WBC, UA 3-6  <3 WBC/hpf   RBC / HPF 0-2  <3 RBC/hpf   Bacteria, UA FEW (*) RARE   Casts GRANULAR CAST (*) NEGATIVE   Comment: WAXY CAST   Urine-Other AMORPHOUS  URATES/PHOSPHATES    CULTURE, BLOOD (ROUTINE X 2)     Status: None   Collection Time    02/15/13  8:55 PM      Result Value Range   Specimen Description BLOOD ARM RIGHT     Special Requests BOTTLES DRAWN AEROBIC AND ANAEROBIC 10CC     Culture  Setup Time       Value: 02/16/2013 04:12     Performed at [facility]   Culture       Value:        BLOOD CULTURE RECEIVED NO GROWTH TO DATE CULTURE WILL BE HELD FOR 5 DAYS BEFORE  ISSUING A FINAL NEGATIVE REPORT     Performed at [facility]   Report Status PENDING    CULTURE, BLOOD (ROUTINE X 2)     Status: None   Collection Time    02/15/13  9:05 PM      Result Value Range   Specimen Description BLOOD ARM LEFT     Special Requests BOTTLES DRAWN AEROBIC ONLY 10CC     Culture  Setup Time       Value: 02/16/2013 04:12     Performed at [facility]   Culture       Value:        BLOOD CULTURE RECEIVED NO GROWTH TO DATE CULTURE WILL BE HELD FOR 5 DAYS BEFORE ISSUING A FINAL NEGATIVE REPORT     Performed at [facility]   Report Status PENDING    GLUCOSE, CAPILLARY     Status: Abnormal   Collection Time    02/15/13 11:43 PM      Result Value Range   Glucose-Capillary 209 (*) 70 - 99 mg/dL  MRSA PCR SCREENING     Status: Abnormal   Collection Time    02/16/13  1:08 AM      Result Value Range   MRSA by PCR POSITIVE (*) NEGATIVE   Comment:            The GeneXpert MRSA Assay (FDA     approved for NASAL specimens     only), is one component of a     comprehensive MRSA colonization     surveillance program. It is not     intended to diagnose MRSA     infection nor to guide or     monitor treatment for     MRSA infections.     RESULT CALLED TO, READ BACK BY AND VERIFIED WITH:     HARAWAY,J,RN 0230 02/16/13 MITCHELL,L  HEMOGLOBIN A1C     Status: Abnormal   Collection Time    02/16/13  4:00 AM      Result Value Range   Hemoglobin A1C 6.7 (*) <5.7 %   Comment: (NOTE)                                                                                According to the ADA Clinical Practice Recommendations for 2011, when     HbA1c is used as a screening test:      >=6.5%   Diagnostic of Diabetes Mellitus               (if abnormal result is confirmed)     5.7-6.4%   Increased risk of developing Diabetes Mellitus     References:Diagnosis and Classification of Diabetes Mellitus,Diabetes     Care,2011,34(Suppl 1):S62-S69 and Standards of Medical Care in             Diabetes - 2011,Diabetes Care,2011,34 (Suppl 1):S11-S61.   Mean Plasma Glucose 146 (*) <117 mg/dL   Comment: Performed at [facility]  GLUCOSE, CAPILLARY     Status: Abnormal   Collection Time    02/16/13  4:01 AM      Result Value Range   Glucose-Capillary 236 (*)  70 - 99 mg/dL  BASIC METABOLIC PANEL     Status: Abnormal   Collection Time    02/16/13  4:50 AM      Result Value Range   Sodium 154 (*) 135 - 145 mEq/L   Potassium 4.5  3.5 - 5.1 mEq/L   Chloride 118 (*) 96 - 112 mEq/L   CO2 20  19 - 32 mEq/L   Glucose, Bld 244 (*) 70 - 99 mg/dL   BUN 161 (*) 6 - 23 mg/dL   Creatinine, Ser 0.96 (*) 0.50 - 1.35 mg/dL   Calcium 7.7 (*) 8.4 - 10.5 mg/dL   GFR calc non Af Amer 7 (*) >90 mL/min   GFR calc Af Amer 9 (*) >90 mL/min   Comment: (NOTE)     The eGFR has been calculated using the CKD EPI equation.     This calculation has not been validated in all clinical situations.     eGFR's persistently <90 mL/min signify possible Chronic Kidney     Disease.  CBC     Status: Abnormal   Collection Time    02/16/13  4:50 AM      Result Value Range   WBC 9.8  4.0 - 10.5 K/uL   RBC 3.76 (*) 4.22 - 5.81 MIL/uL   Hemoglobin 11.4 (*) 13.0 - 17.0 g/dL   HCT 04.5 (*) 40.9 - 81.1 %   MCV 96.3  78.0 - 100.0 fL   MCH 30.3  26.0 - 34.0 pg   MCHC 31.5  30.0 - 36.0 g/dL   RDW 91.4  78.2 - 95.6 %   Platelets 236  150 - 400 K/uL  GLUCOSE, CAPILLARY     Status: Abnormal   Collection Time    02/16/13  8:01 AM      Result Value Range    Glucose-Capillary 150 (*) 70 - 99 mg/dL  GLUCOSE, CAPILLARY     Status: Abnormal   Collection Time    02/16/13 12:36 PM      Result Value Range   Glucose-Capillary 137 (*) 70 - 99 mg/dL  GLUCOSE, CAPILLARY     Status: Abnormal   Collection Time    02/16/13  4:34 PM      Result Value Range   Glucose-Capillary 136 (*) 70 - 99 mg/dL  GLUCOSE, CAPILLARY     Status: Abnormal   Collection Time    02/16/13  9:19 PM      Result Value Range   Glucose-Capillary 163 (*) 70 - 99 mg/dL  RENAL FUNCTION PANEL     Status: Abnormal   Collection Time    02/17/13  5:43 AM      Result Value Range   Sodium 163 (*) 135 - 145 mEq/L   Comment: REPEATED TO VERIFY     CRITICAL RESULT CALLED TO, READ BACK BY AND VERIFIED WITH:     E.MILIANO,RN 0732 02/17/13 CLARK,S   Potassium 3.7  3.5 - 5.1 mEq/L   Chloride 128 (*) 96 - 112 mEq/L   CO2 18 (*) 19 - 32 mEq/L   Glucose, Bld 163 (*) 70 - 99 mg/dL   BUN 213 (*) 6 - 23 mg/dL   Creatinine, Ser 0.86 (*) 0.50 - 1.35 mg/dL   Calcium 7.6 (*) 8.4 - 10.5 mg/dL   Phosphorus 4.3  2.3 - 4.6 mg/dL   Albumin 1.8 (*) 3.5 - 5.2 g/dL   GFR calc non Af Amer 8 (*) >90 mL/min   GFR calc Af Amer 9 (*) >  90 mL/min   Comment: (NOTE)     The eGFR has been calculated using the CKD EPI equation.     This calculation has not been validated in all clinical situations.     eGFR's persistently <90 mL/min signify possible Chronic Kidney     Disease.  CBC     Status: Abnormal   Collection Time    02/17/13  5:43 AM      Result Value Range   WBC 7.7  4.0 - 10.5 K/uL   Comment: WHITE COUNT CONFIRMED ON SMEAR   RBC 3.51 (*) 4.22 - 5.81 MIL/uL   Hemoglobin 10.6 (*) 13.0 - 17.0 g/dL   HCT 78.2 (*) 95.6 - 21.3 %   MCV 94.9  78.0 - 100.0 fL   MCH 30.2  26.0 - 34.0 pg   MCHC 31.8  30.0 - 36.0 g/dL   RDW 08.6  57.8 - 46.9 %   Platelets 298  150 - 400 K/uL  GLUCOSE, CAPILLARY     Status: Abnormal   Collection Time    02/17/13  5:56 AM      Result Value Range   Glucose-Capillary 130  (*) 70 - 99 mg/dL  GLUCOSE, CAPILLARY     Status: Abnormal   Collection Time    02/17/13 11:20 AM      Result Value Range   Glucose-Capillary 110 (*) 70 - 99 mg/dL   Comment 1 Notify RN       ROS:  Review of systems not obtained due to patient factors.  He can get out of him that he is thirsty   Physical Exam: Filed Vitals:   02/17/13 0526  BP: 148/74  Pulse: 98  Temp: 97.8 F (36.6 C)  Resp: 20     General: Alert white male who actually appears a little bit younger than his stated age. He is nonverbal but can follow commands HEENT: Pupils are equal round reactive to light, extraocular motions are intact, mucous membranes are extremely dry Neck:There is no jugular venous suspension appreciated. No lymphadenopathy or carotid bruits Heart: Borderline tachycardic without murmur Lungs: Poor effort Abdomen: Soft, nontender, nondistended Extremities: There is no peripheral edema Skin: Dry Neuro: Patient with dense left hemiparesis. He is nonverbal. However, he can follow commands  Assessment/Plan: 72 year old white male with severe EVA limiting oral intake who presents with what appears to be volume completion and acute on chronic renal failure with hypernatremia. 1.Renal- His acute on chronic renal failure presentation seems to be due to volume depletion. It has improved slightly with hydration and he actually has a reasonable urine output. His renal ultrasound is consistent with chronic kidney disease. He has a baseline creatinine of 1.7 although I'm not sure when that was. It was recent, then I would suspect he has a reasonable expectation to continue to improve his renal function with gentle hydration. If, however it was quite some time ago like a year,  then we may not be as fortunate. He has no acute indications for dialysis at this time. I agree he would be a marginal candidate for it.  2. Hypertension/volume  - Overall to me he appears dry. I agree with not letting his chest  x-ray stop him from getting IV fluids. I think 75 cc an hour is a reasonable rate at this time and we'll continue to follow labs. 3. Pulmonary- it is entirely possible that he could be aspirating given his issues. He is on empiric antibiotics. 4. Anemia  - Is situational.  I will likely worsen as he gets more hydrated. He's not below the level of transfusion or ESA at this time. This will continue to be followed.   Thank you for this consultation. We will continue to follow   Worley Radermacher A 02/17/2013, 11:44 AM

## 2013-02-17 NOTE — Progress Notes (Signed)
Patient alert throughout shift, family at bedside for part of shift.  Unable to assess orientation status because patient has difficulty speaking.  He gestures and nods appropriately at times.  Patient remains NPO at this time; oral care provided to patient every 2 hours throughout shift.  Nasotracheal suction performed by Respiratory Therapy this AM as patient had audible gurgling in throat.  Patient's discomfort was relieved after suctioning; rated PAINAD score of 0.  Will continue to monitor.

## 2013-02-17 NOTE — Progress Notes (Signed)
Thank you for consulting the Palliative Medicine Team at [facility] to meet your patient's and family's needs.   The reason that you asked [place] to see your patient is  For GOC  We have scheduled your patient for a meeting: 12/24 3 pm  The Surrogate decision make is: Clarice Pole ( although he is not sure if his brother is HCPOA) Contact information: [phone]  Other family members that need to be present: Other brother    Your patient is able/unable to participate: non verbal  Additional Narrative:  Nurse reports NTS for gurgling but not otherwise uncomfortable.   Dustee Bottenfield L. Ladona Ridgel, MD MBA The Palliative Medicine Team at [facility] Phone: [phone] Pager: [phone]

## 2013-02-17 NOTE — Progress Notes (Signed)
[facility] PROGRESS NOTE    Eric Graves ZOX:[id number] DOB: 1940-06-06 DOA: 02/15/2013 PCP: Bufford Spikes, DO  HPI/Brief narrative 72 y.o. male with hx of HTN, CVA with left side hemiplegia, dysphagia requiring nectar thickened food, HTN, hx of COPD, brought to the ER as he was having increase shortness of breath. He does not communicate and denied he was having shortness of breath. Evalaution in the ER showed CXR with mild vascular congestion, AKI with Cr of 6.64 and BUN of 132. His baseline Cr was 1.7. He has a WBC of 13K, a Na of 155, and normal LFTs. Hospitalist was asked to admit him for AKI. He was started on VAN/Zosyn for ? PNA.  Assessment/Plan:  Acute on chronic renal failure - Likely secondary to volume depletion - Creatinine has slightly improved compared to yesterday. - Baseline creatinine in H&P was recorded as 1.7 but unsure as to when and where it was obtained from. - IV fluids changed to hypotonic fluids and continue gentle hydration. - Nephrology consultation appreciated. - Follow BMP in a.m.  Hypernatremic dehydration - Continue D5 water.  Anemia - Possibly chronic. Monitor CBCs closely.  Dysphagia - Patient failed swallow evaluation. May have to be reassessed.  Type II DM - Reasonably controlled. However due to n.p.o. status, change SSI 2 every 4 hours.  Presumed pneumonia Chest x-ray however does not show pneumonia. Followup chest x-ray tomorrow and consider discontinuing Rocephin. He is however at risk for aspiration. If patient's respiratory status declines, consider BiPAP.   Failure to thrive - Palliative care team input appreciated.    Code Status: DO NOT RESUSCITATE  Family Communication: Discussed with son via phone  Disposition Plan: To be determined    Consultants:  Palliative care team    nephrology  Procedures:  None   Antibiotics:  IV Rocephin    Subjective: Patient denies complaints. Denies dyspnea. He was seen this  morning.   Objective: Filed Vitals:   02/17/13 0526 02/17/13 0900 02/17/13 1146 02/17/13 1350  BP: 148/74  153/61 162/63  Pulse: 98  90 89  Temp: 97.8 F (36.6 C)   98.4 F (36.9 C)  TempSrc: Oral   Oral  Resp: 20   18  Height:      Weight:      SpO2: 92% 95% 92% 94%    Intake/Output Summary (Last 24 hours) at 02/17/13 1835 Last data filed at 02/17/13 1619  Gross per 24 hour  Intake 1282.5 ml  Output    895 ml  Net  387.5 ml   Filed Weights   02/15/13 2314 02/16/13 0424 02/17/13 0348  Weight: 66.4 kg (146 lb 6.2 oz) 65.9 kg (145 lb 4.5 oz) 66.9 kg (147 lb 7.8 oz)     Exam:  General exam: Chronically ill looking elderly male lying propped up in bed in no obvious distress  Respiratory system:  reduced breath sounds bilaterally with scattered bilateral crackles but no wheezing or rhonchi . No increased work of breathing. Cardiovascular system: S1 & S2 heard, RRR. No JVD, murmurs, gallops, clicks or pedal edema. telemetry: Sinus rhythm.? PAF  Gastrointestinal system: Abdomen is nondistended, soft and nontender. Normal bowel sounds heard. Central nervous system: Alert and oriented to self and obeys some commands . No focal neurological deficits. Extremities: Symmetric 4x 5 power.   Data Reviewed: Basic Metabolic Panel:  Recent Labs Lab 02/15/13 1908 02/16/13 0450 02/17/13 0543  NA 155* 154* 163*  K 4.4 4.5 3.7  CL 117* 118* 128*  CO2  22 20 18*  GLUCOSE 200* 244* 163*  BUN 132* 133* 122*  CREATININE 6.64* 6.62* 6.20*  CALCIUM 8.1* 7.7* 7.6*  PHOS  --   --  4.3   Liver Function Tests:  Recent Labs Lab 02/15/13 1908 02/17/13 0543  AST 31  --   ALT 24  --   ALKPHOS 76  --   BILITOT 0.4  --   PROT 6.0  --   ALBUMIN 2.0* 1.8*   No results found for this basename: LIPASE, AMYLASE,  in the last 168 hours No results found for this basename: AMMONIA,  in the last 168 hours CBC:  Recent Labs Lab 02/15/13 1908 02/16/13 0450 02/17/13 0543  WBC 13.5* 9.8  7.7  NEUTROABS 10.1*  --   --   HGB 11.9* 11.4* 10.6*  HCT 38.0* 36.2* 33.3*  MCV 97.4 96.3 94.9  PLT 235 236 298   Cardiac Enzymes:  Recent Labs Lab 02/15/13 1908  TROPONINI <0.30   BNP (last 3 results)  Recent Labs  02/15/13 1908  PROBNP 987.2*   CBG:  Recent Labs Lab 02/16/13 1634 02/16/13 2119 02/17/13 0556 02/17/13 1120 02/17/13 1617  GLUCAP 136* 163* 130* 110* 114*    Recent Results (from the past 240 hour(s))  URINE CULTURE     Status: None   Collection Time    02/15/13  8:30 PM      Result Value Range Status   Specimen Description URINE, CATHETERIZED   Final   Special Requests NONE   Final   Culture  Setup Time     Final   Value: 02/16/2013 01:51     Performed at [facility] Count     Final   Value: NO GROWTH     Performed at [facility]   Culture     Final   Value: NO GROWTH     Performed at [facility]   Report Status 02/16/2013 FINAL   Final  CULTURE, BLOOD (ROUTINE X 2)     Status: None   Collection Time    02/15/13  8:55 PM      Result Value Range Status   Specimen Description BLOOD ARM RIGHT   Final   Special Requests BOTTLES DRAWN AEROBIC AND ANAEROBIC 10CC   Final   Culture  Setup Time     Final   Value: 02/16/2013 04:12     Performed at [facility]   Culture     Final   Value:        BLOOD CULTURE RECEIVED NO GROWTH TO DATE CULTURE WILL BE HELD FOR 5 DAYS BEFORE ISSUING A FINAL NEGATIVE REPORT     Performed at [facility]   Report Status PENDING   Incomplete  CULTURE, BLOOD (ROUTINE X 2)     Status: None   Collection Time    02/15/13  9:05 PM      Result Value Range Status   Specimen Description BLOOD ARM LEFT   Final   Special Requests BOTTLES DRAWN AEROBIC ONLY 10CC   Final   Culture  Setup Time     Final   Value: 02/16/2013 04:12     Performed at [facility]   Culture     Final   Value:        BLOOD CULTURE RECEIVED NO GROWTH TO DATE CULTURE WILL BE HELD FOR 5  DAYS BEFORE ISSUING A FINAL NEGATIVE REPORT     Performed at  [facility]   Report Status PENDING   Incomplete  MRSA PCR SCREENING     Status: Abnormal   Collection Time    02/16/13  1:08 AM      Result Value Range Status   MRSA by PCR POSITIVE (*) NEGATIVE Final   Comment:            The GeneXpert MRSA Assay (FDA     approved for NASAL specimens     only), is one component of a     comprehensive MRSA colonization     surveillance program. It is not     intended to diagnose MRSA     infection nor to guide or     monitor treatment for     MRSA infections.     RESULT CALLED TO, READ BACK BY AND VERIFIED WITH:     HARAWAY,J,RN 0230 02/16/13 MITCHELL,L         Studies: US Renal  02/16/2013   CLINICAL DATA:  Acute renal insufficiency, history of hypertension and diabetes  EXAM: RENAL/URINARY TRACT ULTRASOUND COMPLETE  COMPARISON:  None.  FINDINGS: Right Kidney:  Length: 11.2 cm. The echotexture of the renal cortex on the right is approximately equal to or slightly greater than that of the adjacent liver. There is an approximately 2.5 x 2.1 x 2.3 cm diameter anechoic structure consistent with a cyst in the upper pole. There is no hydronephrosis.  Left Kidney:  Length: 9.6 cm. The cortical echotexture on the left appears mildly increased. There is an approximately 1.8 x 1.6 x 1.6 cm cyst in the midpole as well as a 2.5 x 2.4 x 4.3 cm diameter cyst exophytic from the lower pole.  Bladder:  The urinary bladder contains a Foley catheter.  IMPRESSION: 1. The echotexture of the renal cortex appears mildly increased bilaterally consistent with medical renal disease. There are no findings to suggest hydronephrosis. 2. There are simple appearing cysts in both kidneys.   Electronically Signed   By: David  [place]   On: 02/16/2013 09:09   Dg Swallowing Func-speech Pathology  02/16/2013   Gray Bernhardt, CCC-SLP     02/16/2013 12:59 PM Objective Swallowing Evaluation: Modified Barium Swallowing  Study   Patient Details  Name: Eric Graves MRN: [id number] Date of Birth: Feb 04, 1941  Today's Date: 02/16/2013 Time: 1200-1230 SLP Time Calculation (min): 30 min  Past Medical History:  Past Medical History  Diagnosis Date  . Hyperlipidemia   . Diabetes mellitus without complication   . Anemia   . Hypertension   . Stroke   . Arthritis   . Dysphagia   . Vitamin D deficiency   . Depressive disorder, not elsewhere classified   . Unspecified hereditary and idiopathic peripheral neuropathy   . 9 completed weeks of gestation   . Malignant hypertensive heart/renal dis   . COPD (chronic obstructive pulmonary disease)    Past Surgical History: History reviewed. No pertinent past  surgical history. HPI:  Eric Graves is an 72 y.o. male with hx of HTN, CVA with left  side hemiplegia, dysphagia requiring nectar thickened food, HTN,  hx of COPD, brought to the ER as he was having increase shortness  of breath.  He does not communicate, but denied he was having  shortness of breath.     Assessment / Plan / Recommendation Clinical Impression  Dysphagia Diagnosis: Severe oral phase dysphagia;Severe  pharyngeal phase dysphagia Clinical impression: Prior to start of MBS, pt was noted to  exhibit congested breath  sounds.  No vocalization elicited.  Pt  exhibits severe oropharyngeal sensory and motor based dysphagia.   Initial 1/2 tsp boluses elicited no swallow reflex.  Pt was noted  to hold bolus orally with absent formation or posterior  propulsion. Presentation of nectar thick liquid bolus resulted in  spillage to pyriform sinuses, and penetration and aspiration  during the swallow with no cough response.   Pt is unable to  follow directions for airway protection.  Remaining oral residue  was suctioned from pt mouth.  Pt is NOT SAFE for po intake at  this time.    Treatment Recommendation  Therapy as outlined in treatment plan below    Diet Recommendation NPO   Liquid Administration via: Cup;No straw;Spoon Medication  Administration: Via alternative means Supervision: Full supervision/cueing for compensatory  strategies;Staff to assist with self feeding Compensations: Slow rate;Small sips/bites;Check for pocketing Postural Changes and/or Swallow Maneuvers: Seated upright 90  degrees;Upright 30-60 min after meal    Other  Recommendations Recommended Consults: MBS Oral Care Recommendations: Oral care Q4 per protocol Other Recommendations: Order thickener from pharmacy   Follow Up Recommendations  24 hour supervision/assistance;Skilled Nursing facility    Frequency and Duration min 1 x/week  1 week   Pertinent Vitals/Pain VSS, no pain indicated    SLP Swallow Goals  see care plan   General Date of Onset:  (chronic) HPI: Eric Graves is an 72 y.o. male with hx of HTN, CVA with  left side hemiplegia, dysphagia requiring nectar thickened food,  HTN, hx of COPD, brought to the ER as he was having increase  shortness of breath.  He does not communicate, but denied he was  having shortness of breath. Type of Study: Modified Barium Swallowing Study Reason for Referral: Objectively evaluate swallowing function Previous Swallow Assessment: BSE 02/16/13; OP MBS 09/26/11 with  recommendations fo Dys 3 and nectar thick liquids with  consideration for water protocol Diet Prior to this Study: Regular;Nectar-thick liquids Temperature Spikes Noted: No Respiratory Status: Nasal cannula History of Recent Intubation: No Behavior/Cognition: Alert;Requires cueing;Doesn't follow  directions Oral Cavity - Dentition: Missing dentition Oral Motor / Sensory Function: Impaired - see Bedside swallow  eval Self-Feeding Abilities: Total assist Patient Positioning: Upright in chair Baseline Vocal Quality: Clear Volitional Cough: Cognitively unable to elicit Volitional Swallow: Unable to elicit Anatomy: Within functional limits Pharyngeal Secretions: Not observed secondary MBS    Reason for Referral Objectively evaluate swallowing function   Oral Phase Oral  Preparation/Oral Phase Oral Phase: Impaired Oral - Nectar Oral - Nectar Teaspoon: Weak lingual manipulation;Lingual  pumping;Holding of bolus;Reduced posterior propulsion Oral - Nectar Straw: Weak lingual manipulation;Lingual  pumping;Holding of bolus;Reduced posterior propulsion;Piecemeal  swallowing Oral - Solids Oral - Puree: Weak lingual manipulation;Lingual  pumping;Incomplete tongue to palate contact;Reduced posterior  propulsion;Holding of bolus;Piecemeal swallowing   Pharyngeal Phase Pharyngeal Phase Pharyngeal Phase: Impaired Pharyngeal - Nectar Pharyngeal - Nectar Teaspoon:  (ABSENT SWALLOW) Pharyngeal - Nectar Straw: Penetration/Aspiration during  swallow;Premature spillage to pyriform sinuses;Reduced pharyngeal  peristalsis;Reduced epiglottic inversion;Reduced laryngeal  elevation;Reduced airway/laryngeal closure;Moderate  aspiration;Reduced tongue base retraction;Pharyngeal residue -  valleculae;Pharyngeal residue - posterior pharnyx Penetration/Aspiration details (nectar straw): Material enters  airway, passes BELOW cords without attempt by patient to eject  out (silent aspiration) Pharyngeal - Solids Pharyngeal - Puree:  (ABSENT SWALLOW) Pharyngeal Phase - Comment Pharyngeal Comment: Puree consistency suctioned from oral cavity  Cervical Esophageal Phase    GO  Celia B. Murvin Natal [facility], CCC-SLP [phone]   Cervical Esophageal Phase Cervical Esophageal  Phase: WFL         [name] 02/16/2013, 12:58 PM         Scheduled Meds: . albuterol  2.5 mg Nebulization TID  . antiseptic oral rinse  15 mL Mouth Rinse BID  . cefTRIAXone (ROCEPHIN)  IV  1 g Intravenous Q24H  . Chlorhexidine Gluconate Cloth  6 each Topical Q0600  . heparin  5,000 Units Subcutaneous Q8H  . insulin aspart  0-15 Units Subcutaneous TID WC  . insulin aspart  0-5 Units Subcutaneous QHS  . insulin aspart  4 Units Subcutaneous TID WC  . insulin glargine  20 Units Subcutaneous QHS  . metoprolol  5 mg Intravenous  Q6H  . mupirocin ointment  1 application Nasal BID  . sodium chloride  3 mL Intravenous Q12H   Continuous Infusions: . dextrose 75 mL/hr at 02/17/13 0747    Principal Problem:   AKI (acute kidney injury) Active Problems:   Type II or unspecified type diabetes mellitus with neurological manifestations, not stated as uncontrolled(250.60)   Essential hypertension, benign   Other dysphagia   CVA (cerebral vascular accident)   COPD (chronic obstructive pulmonary disease)   CHF (congestive heart failure)   DNR (do not resuscitate)    Time spent: 45 minutes    Kyanne Rials, MD, FACP, FHM. [facility] Pager [phone]  If 7PM-7AM, please contact night-coverage [email or web address] Password [facility] 02/17/2013, 6:35 PM    LOS: 2 days

## 2013-02-17 NOTE — Progress Notes (Signed)
CRITICAL VALUE ALERT  Critical value received:  Sodium 163  Date of notification:  02/17/2013  Time of notification:  07:31  Critical value read back:yes  Nurse who received alert:  Doroteo Glassman, RN  MD notified (1st page):  Dr. Waymon Amato  Time of first page:  07:34  MD notified (2nd page):  Time of second page:  Responding MD:  Dr. Waymon Amato  Time MD responded: 07:42

## 2013-02-17 NOTE — Clinical Documentation Improvement (Signed)
  Per ED MD Note: "from SNF for respiratory distress, hypoxia (recorded in 70s), placed on BiPAP by EMS" and "w/ acute respiratory failure".   If you agree with "acute respiratory failure" on presentation please add to Notes and DC summary to illustrate this patient's severity of illness and risk of mortality. Thank you.  Thank You, Beverley Fiedler ,RN Clinical Documentation Specialist:  [phone]  [facility]- Health Information Management

## 2013-02-17 NOTE — Progress Notes (Signed)
Clinical Social Work Department BRIEF PSYCHOSOCIAL ASSESSMENT 02/17/2013  Patient:  Eric Graves, Eric Graves     Account Number:  [id number]     Admit date:  02/15/2013  Clinical Social Worker:  Dennison Bulla  Date/Time:  02/17/2013 12:45 PM  Referred by:  Physician  Date Referred:  02/17/2013 Referred for  SNF Placement   Other Referral:   Interview type:  Family Other interview type:    PSYCHOSOCIAL DATA Living Status:  FACILITY Admitted from facility:  [facility] [name] Level of care:  Skilled Nursing Facility Primary support name:  Casimiro Needle Primary support relationship to patient:  CHILD, ADULT Degree of support available:   Strong    CURRENT CONCERNS Current Concerns  Post-Acute Placement   Other Concerns:    SOCIAL WORK ASSESSMENT / PLAN CSW received referral due to patient being admitted from a facility. CSW reviewed chart which stated that patient is disoriented x4. CSW called and spoke with son via phone.    Son reports that patient is a resident of [facility] and has lived there since June 2013. Son reports that patient should return to facility at [place]. Son reports he has been kept aware of plans and agreeable to plans. CSW explained process and son agreeable to all plans.    CSW completed FL2 and spoke with SNF who is agreeable to accept patient at [place].   Assessment/plan status:  Psychosocial Support/Ongoing Assessment of Needs Other assessment/ plan:   Information/referral to community resources:   Will return to SNF    PATIENT'S/FAMILY'S RESPONSE TO PLAN OF CARE: Patient unable to participate in assessment. Patient's son engaged during assessment and son agreeable for patient to return to SNF at DC. Son thanked CSW for time.       Unk Lightning, LCSW  (Coverage for Lovette Cliche)

## 2013-02-18 ENCOUNTER — Inpatient Hospital Stay (HOSPITAL_COMMUNITY): Payer: Medicare Other

## 2013-02-18 DIAGNOSIS — Z515 Encounter for palliative care: Secondary | ICD-10-CM

## 2013-02-18 DIAGNOSIS — I6350 Cerebral infarction due to unspecified occlusion or stenosis of unspecified cerebral artery: Secondary | ICD-10-CM

## 2013-02-18 LAB — BASIC METABOLIC PANEL
BUN: 108 mg/dL — ABNORMAL HIGH (ref 6–23)
Calcium: 7.4 mg/dL — ABNORMAL LOW (ref 8.4–10.5)
Creatinine, Ser: 5.45 mg/dL — ABNORMAL HIGH (ref 0.50–1.35)
GFR calc Af Amer: 11 mL/min — ABNORMAL LOW (ref >90)
GFR calc non Af Amer: 9 mL/min — ABNORMAL LOW (ref >90)
Glucose, Bld: 180 mg/dL — ABNORMAL HIGH (ref 70–99)

## 2013-02-18 LAB — CBC
Hemoglobin: 11.5 g/dL — ABNORMAL LOW (ref 13.0–17.0)
MCH: 30.3 pg (ref 26.0–34.0)
MCHC: 31.6 g/dL (ref 30.0–36.0)
MCV: 96 fL (ref 78.0–100.0)
RBC: 3.79 MIL/uL — ABNORMAL LOW (ref 4.22–5.81)
RDW: 14.7 % (ref 11.5–15.5)
WBC: 13.2 10*3/uL — ABNORMAL HIGH (ref 4.0–10.5)

## 2013-02-18 LAB — GLUCOSE, CAPILLARY
Glucose-Capillary: 123 mg/dL — ABNORMAL HIGH (ref 70–99)
Glucose-Capillary: 163 mg/dL — ABNORMAL HIGH (ref 70–99)
Glucose-Capillary: 148 mg/dL — ABNORMAL HIGH (ref 70–99)
Glucose-Capillary: 145 mg/dL — ABNORMAL HIGH (ref 70–99)

## 2013-02-18 MED ORDER — POTASSIUM CHLORIDE 10 MEQ/100ML IV SOLN
10.0000 meq | INTRAVENOUS | Status: AC
  Administered 2013-02-18 (×2): 10 meq via INTRAVENOUS
  Filled 2013-02-18 (×2): qty 100

## 2013-02-18 NOTE — Progress Notes (Signed)
[facility] PROGRESS NOTE    Eric Graves WUJ:[id number] DOB: 18-Jun-1940 DOA: 02/15/2013 PCP: Bufford Spikes, DO  HPI/Brief narrative 72 y.o. male with hx of HTN, CVA with left side hemiplegia, dysphagia requiring nectar thickened food, HTN, hx of COPD, brought to the ER as he was having increase shortness of breath. He does not communicate and denied he was having shortness of breath. Evalaution in the ER showed CXR with mild vascular congestion, AKI with Cr of 6.64 and BUN of 132. His baseline Cr was 1.7. He has a WBC of 13K, a Na of 155, and normal LFTs. Hospitalist was asked to admit him for AKI. He was started on VAN/Zosyn for ? PNA.  Assessment/Plan:  Acute on chronic renal failure - Likely secondary to volume depletion - Creatinine has slightly improved compared to yesterday. 6.62 > 6.2 > 5.6 a >5.45 - Baseline creatinine in H&P was recorded as 1.7 but unsure as to when and where it was obtained from. - IV fluids changed to hypotonic fluids and increased to 125 ml/h today. He seems to be tolerating IV fluids without any worsening respiratory status. Continues to make urine. - Nephrology consultation & followup appreciated. - Follow BMP in a.m.  Hypernatremic dehydration - Continue D5 water-increased to 125 miles per hour. Sodium had decreased slightly last night but has gone up again this morning. - Will request speech therapy to reevaluate 12/26 and if still n.p.o. recommended, discuss with family regarding NG tube for free water.   Anemia - Possibly chronic.  stable   Dysphagia - Patient failed swallow evaluation. May have to be reassessed. management as above   Type II DM - Reasonably controlled. However due to n.p.o. status, change SSI 2 every 4 hours.  Presumed pneumonia-left lower lobe/basilar  Chest x-ray however  initially did not show pneumonia. Followup chest x-ray tomorrow  suggest possible pneumonia-probably aspiration . If patient's respiratory status  declines, consider BiPAP. Continue IV Rocephin.no UTI-urine cultures negative.    Failure to thrive - Palliative care team input appreciated.    Code Status: DO NOT RESUSCITATE  Family Communication: Discussed with son via phone  on 12/24.  Disposition Plan: To be determined    Consultants:  Palliative care team    nephrology  Procedures:  None   Antibiotics:  IV Rocephin    Subjective:  patient nods and denies dyspnea. As per nursing, no acute events.  Objective: Filed Vitals:   02/17/13 2058 02/17/13 2305 02/18/13 0507 02/18/13 0510  BP: 140/78 153/71 154/70   Pulse: 78 87 96   Temp: 97.7 F (36.5 C)  97.3 F (36.3 C)   TempSrc: Axillary     Resp: 17  21   Height:      Weight:    66.679 kg (147 lb)  SpO2: 95%  93%     Intake/Output Summary (Last 24 hours) at 02/18/13 1344 Last data filed at 02/18/13 0900  Gross per 24 hour  Intake 1212.5 ml  Output    825 ml  Net  387.5 ml   Filed Weights   02/16/13 0424 02/17/13 0348 02/18/13 0510  Weight: 65.9 kg (145 lb 4.5 oz) 66.9 kg (147 lb 7.8 oz) 66.679 kg (147 lb)     Exam:  General exam: Chronically ill looking elderly male lying propped up in bed in no obvious distress  Respiratory system:   Breath sounds bilateral fair and improved compared to yesterday. Slightly reduced breath sounds in the bases with occasional basal crackles. No wheezing  or rhonchi  . No increased work of breathing. Cardiovascular system: S1 & S2 heard, RRR. No JVD, murmurs, gallops, clicks or pedal edema. telemetry: Sinus rhythm.? PAF  Gastrointestinal system: Abdomen is nondistended, soft and nontender. Normal bowel sounds heard. Central nervous system: Alert and oriented to self and obeys some commands . No focal neurological deficits. Extremities: Symmetric 4x 5 power.   Data Reviewed: Basic Metabolic Panel:  Recent Labs Lab 02/15/13 1908 02/16/13 0450 02/17/13 0543 02/17/13 2020 02/18/13 0915  NA 155* 154* 163* 158*  160*  K 4.4 4.5 3.7 3.2* 3.3*  CL 117* 118* 128* 125* 125*  CO2 22 20 18* 19 21  GLUCOSE 200* 244* 163* 143* 180*  BUN 132* 133* 122* 111* 108*  CREATININE 6.64* 6.62* 6.20* 5.68* 5.45*  CALCIUM 8.1* 7.7* 7.6* 7.5* 7.4*  PHOS  --   --  4.3  --   --    Liver Function Tests:  Recent Labs Lab 02/15/13 1908 02/17/13 0543  AST 31  --   ALT 24  --   ALKPHOS 76  --   BILITOT 0.4  --   PROT 6.0  --   ALBUMIN 2.0* 1.8*   No results found for this basename: LIPASE, AMYLASE,  in the last 168 hours No results found for this basename: AMMONIA,  in the last 168 hours CBC:  Recent Labs Lab 02/15/13 1908 02/16/13 0450 02/17/13 0543 02/18/13 0915  WBC 13.5* 9.8 7.7 13.2*  NEUTROABS 10.1*  --   --   --   HGB 11.9* 11.4* 10.6* 11.5*  HCT 38.0* 36.2* 33.3* 36.4*  MCV 97.4 96.3 94.9 96.0  PLT 235 236 298 282   Cardiac Enzymes:  Recent Labs Lab 02/15/13 1908  TROPONINI <0.30   BNP (last 3 results)  Recent Labs  02/15/13 1908  PROBNP 987.2*   CBG:  Recent Labs Lab 02/17/13 2007 02/17/13 2304 02/18/13 0449 02/18/13 0809 02/18/13 1204  GLUCAP 126* 123* 144* 163* 148*    Recent Results (from the past 240 hour(s))  URINE CULTURE     Status: None   Collection Time    02/15/13  8:30 PM      Result Value Range Status   Specimen Description URINE, CATHETERIZED   Final   Special Requests NONE   Final   Culture  Setup Time     Final   Value: 02/16/2013 01:51     Performed at [facility] Count     Final   Value: NO GROWTH     Performed at [facility]   Culture     Final   Value: NO GROWTH     Performed at [facility]   Report Status 02/16/2013 FINAL   Final  CULTURE, BLOOD (ROUTINE X 2)     Status: None   Collection Time    02/15/13  8:55 PM      Result Value Range Status   Specimen Description BLOOD ARM RIGHT   Final   Special Requests BOTTLES DRAWN AEROBIC AND ANAEROBIC 10CC   Final   Culture  Setup Time     Final   Value:  02/16/2013 04:12     Performed at [facility]   Culture     Final   Value:        BLOOD CULTURE RECEIVED NO GROWTH TO DATE CULTURE WILL BE HELD FOR 5 DAYS BEFORE ISSUING A FINAL NEGATIVE REPORT     Performed at [facility]   Report Status PENDING   Incomplete  CULTURE, BLOOD (ROUTINE X 2)     Status: None   Collection Time    02/15/13  9:05 PM      Result Value Range Status   Specimen Description BLOOD ARM LEFT   Final   Special Requests BOTTLES DRAWN AEROBIC ONLY 10CC   Final   Culture  Setup Time     Final   Value: 02/16/2013 04:12     Performed at [facility]   Culture     Final   Value:        BLOOD CULTURE RECEIVED NO GROWTH TO DATE CULTURE WILL BE HELD FOR 5 DAYS BEFORE ISSUING A FINAL NEGATIVE REPORT     Performed at [facility]   Report Status PENDING   Incomplete  MRSA PCR SCREENING     Status: Abnormal   Collection Time    02/16/13  1:08 AM      Result Value Range Status   MRSA by PCR POSITIVE (*) NEGATIVE Final   Comment:            The GeneXpert MRSA Assay (FDA     approved for NASAL specimens     only), is one component of a     comprehensive MRSA colonization     surveillance program. It is not     intended to diagnose MRSA     infection nor to guide or     monitor treatment for     MRSA infections.     RESULT CALLED TO, READ BACK BY AND VERIFIED WITH:     HARAWAY,J,RN 0230 02/16/13 MITCHELL,L         Studies: Dg Chest 2 View  02/18/2013   CLINICAL DATA:  Short of breath.  Congestion.  EXAM: CHEST  2 VIEW  COMPARISON:  02/15/2013  FINDINGS: There are low lung volumes. There are irregularly thickened bronchovascular markings bilaterally, stable. Or focal opacity is evident in the posterior lung base on the lateral view. A left lower lobe, or possibly by basilar, infiltrate should be suspected in the proper clinical setting.  No pleural effusion or pneumothorax.  The cardiac silhouette is normal in size. The mediastinum is  normal in contour.  IMPRESSION: 1. Findings are similar to the recent prior exam. 2. Possible left lower lobe, or bibasilar, infiltrates.   Electronically Signed   By: Amie Portland M.D.   On: 02/18/2013 09:10        Scheduled Meds: . antiseptic oral rinse  15 mL Mouth Rinse BID  . cefTRIAXone (ROCEPHIN)  IV  1 g Intravenous Q24H  . Chlorhexidine Gluconate Cloth  6 each Topical Q0600  . heparin  5,000 Units Subcutaneous Q8H  . insulin aspart  0-9 Units Subcutaneous Q4H  . insulin glargine  15 Units Subcutaneous QHS  . metoprolol  5 mg Intravenous Q6H  . mupirocin ointment  1 application Nasal BID  . potassium chloride  10 mEq Intravenous Q1 Hr x 2  . sodium chloride  3 mL Intravenous Q12H   Continuous Infusions: . dextrose 125 mL/hr at 02/18/13 1215    Principal Problem:   Renal failure, acute on chronic Active Problems:   Type II or unspecified type diabetes mellitus with neurological manifestations, not stated as uncontrolled(250.60)   Essential hypertension, benign   Other dysphagia   CVA (cerebral vascular accident)   COPD (chronic obstructive pulmonary disease)   AKI (acute kidney injury)   CHF (  congestive heart failure)   DNR (do not resuscitate)   Dehydration with hypernatremia   Dysphagia, unspecified(787.20)    Time spent: 25 minutes    HONGALGI,ANAND, MD, FACP, FHM. [facility] Pager [phone]  If 7PM-7AM, please contact night-coverage [email or web address] Password [email or web address] 02/18/2013, 1:44 PM    LOS: 3 days

## 2013-02-18 NOTE — Progress Notes (Signed)
Pt NTS with no complications. Large amt of thick yellow secretions suctioned. No distress noted. Pt resting comfortably.

## 2013-02-18 NOTE — Progress Notes (Signed)
NTS pt.  BS rhonchi.  Suctioned lg amt of thick yellow secretions.  No distress.  BS clearing.

## 2013-02-18 NOTE — Progress Notes (Signed)
Patient ZO:XWRUEA Latini      DOB: [birth year]      VWU:[id number]   Palliative Medicine Team at [facility] Progress Note    Subjective: Patient awake and comfort.  At baseline with inability to speak, less rhonchi today.  Filed Vitals:   02/18/13 0507  BP: 154/70  Pulse: 96  Temp: 97.3 F (36.3 C)  Resp: 21   Physical exam:  General: no acute distress, less audible rhonchi PERRL, EOMI, anicteric, mm dry Chest : decreased with som dullness, but less rhonchi CVS: regular, S1, S2 Abd soft Ext: contractures of the finger on left hand, able to point with right hand Neuro: garbled speech but shakes head yes and no seemingly appropriately.   CRT: 5.45 , Na 160 WBC: 13.2   Assessment and plan: 72 yr old with history of stroke,dysphagia, and expressive aphasia admitted with altered mental status in acute renal failure with elevation of sodium.  Patient is more awake and alert but his crt and sodium have not changed much.  Family would like to continue treatment for now.  1.  DNR 2.  Hypernatremia:  Continue free water. 3. Dysphagia: please have speech reeval for oral intake.  Total time 15 min   Shenelle Klas L. Ladona Ridgel, MD MBA The Palliative Medicine Team at [facility] Phone: [phone] Pager: [phone]

## 2013-02-18 NOTE — Progress Notes (Signed)
Pt resting comfortably in bed this AM. Pt in NAD. VSS, Report given to oncoming RN. Baron Hamper, RN

## 2013-02-18 NOTE — Progress Notes (Signed)
Subjective:  No really big change.  His sodium went down but then back up-  He is making urine and kidney function is improved but only slightly and is still quite impaired. His o2 req has not changed Objective Vital signs in last 24 hours: Filed Vitals:   02/17/13 2058 02/17/13 2305 02/18/13 0507 02/18/13 0510  BP: 140/78 153/71 154/70   Pulse: 78 87 96   Temp: 97.7 F (36.5 C)  97.3 F (36.3 C)   TempSrc: Axillary     Resp: 17  21   Height:      Weight:    66.679 kg (147 lb)  SpO2: 95%  93%    Weight change: -0.221 kg (-7.8 oz)  Intake/Output Summary (Last 24 hours) at 02/18/13 1138 Last data filed at 02/18/13 0900  Gross per 24 hour  Intake 1212.5 ml  Output    825 ml  Net  387.5 ml   Assessment/Plan: 72 year old white male with severe CVA limiting oral intake who presents with what appears to be volume completion and acute on chronic renal failure with hypernatremia.  1.Renal- His acute on chronic renal failure presentation seems to be due to volume depletion. It has improved slightly with hydration and he actually has a reasonable urine output. His renal ultrasound is consistent with chronic kidney disease. He has a baseline creatinine of 1.7 although I'm not sure when that was. It was recent, then I would suspect he has a reasonable expectation to continue to improve his renal function with gentle hydration. If, however it was quite some time ago like a year, then we may not be as fortunate. He has no acute indications for dialysis at this time. I agree he would be a marginal candidate for it.  2. Hypertension/volume - Overall to me he appears dry. I agree with not letting his chest x-ray stop him from getting IV fluids. I am going to increase his fluids up to 125 cc an hour given the fact that his sodium went back up. If we could get input from speech to understand if we could possibly get him something or even place an NG to give him free water it would be beneficial..  3.  Pulmonary- it is entirely possible that he could be aspirating given his issues. He is on empiric antibiotics. I appreciate the input from palliative care. Patient is a DO NOT INTUBATE /DO NOT RESUSCITATE. His respiratory status has not worsened with fluid so far and he needs more based on his sodium. 4. Anemia - Is stable 5. Hypokalemia - Will replete    Ladelle Teodoro A    Labs: Basic Metabolic Panel:  Recent Labs Lab 02/16/13 0450 02/17/13 0543 02/17/13 2020 02/18/13 0915  NA 154* 163* 158* 160*  K 4.5 3.7 3.2* 3.3*  CL 118* 128* 125* 125*  CO2 20 18* 19 21  GLUCOSE 244* 163* 143* 180*  BUN 133* 122* 111* 108*  CREATININE 6.62* 6.20* 5.68* 5.45*  CALCIUM 7.7* 7.6* 7.5* 7.4*  PHOS  --  4.3  --   --    Liver Function Tests:  Recent Labs Lab 02/15/13 1908 02/17/13 0543  AST 31  --   ALT 24  --   ALKPHOS 76  --   BILITOT 0.4  --   PROT 6.0  --   ALBUMIN 2.0* 1.8*   No results found for this basename: LIPASE, AMYLASE,  in the last 168 hours No results found for this basename: AMMONIA,  in the last  168 hours CBC:  Recent Labs Lab 02/15/13 1908 02/16/13 0450 02/17/13 0543 02/18/13 0915  WBC 13.5* 9.8 7.7 13.2*  NEUTROABS 10.1*  --   --   --   HGB 11.9* 11.4* 10.6* 11.5*  HCT 38.0* 36.2* 33.3* 36.4*  MCV 97.4 96.3 94.9 96.0  PLT 235 236 298 282   Cardiac Enzymes:  Recent Labs Lab 02/15/13 1908  TROPONINI <0.30   CBG:  Recent Labs Lab 02/17/13 1120 02/17/13 1617 02/17/13 2007 02/17/13 2304 02/18/13 0449  GLUCAP 110* 114* 126* 123* 144*    Iron Studies: No results found for this basename: IRON, TIBC, TRANSFERRIN, FERRITIN,  in the last 72 hours Studies/Results: Dg Chest 2 View  02/18/2013   CLINICAL DATA:  Short of breath.  Congestion.  EXAM: CHEST  2 VIEW  COMPARISON:  02/15/2013  FINDINGS: There are low lung volumes. There are irregularly thickened bronchovascular markings bilaterally, stable. Or focal opacity is evident in the  posterior lung base on the lateral view. A left lower lobe, or possibly by basilar, infiltrate should be suspected in the proper clinical setting.  No pleural effusion or pneumothorax.  The cardiac silhouette is normal in size. The mediastinum is normal in contour.  IMPRESSION: 1. Findings are similar to the recent prior exam. 2. Possible left lower lobe, or bibasilar, infiltrates.   Electronically Signed   By: Amie Portland M.D.   On: 02/18/2013 09:10   Dg Swallowing Func-speech Pathology  02/16/2013   Gray Bernhardt, CCC-SLP     02/16/2013 12:59 PM Objective Swallowing Evaluation: Modified Barium Swallowing Study   Patient Details  Name: Eric Graves MRN: [id number] Date of Birth: 15-Mar-1940  Today's Date: 02/16/2013 Time: 1200-1230 SLP Time Calculation (min): 30 min  Past Medical History:  Past Medical History  Diagnosis Date  . Hyperlipidemia   . Diabetes mellitus without complication   . Anemia   . Hypertension   . Stroke   . Arthritis   . Dysphagia   . Vitamin D deficiency   . Depressive disorder, not elsewhere classified   . Unspecified hereditary and idiopathic peripheral neuropathy   . 9 completed weeks of gestation   . Malignant hypertensive heart/renal dis   . COPD (chronic obstructive pulmonary disease)    Past Surgical History: History reviewed. No pertinent past  surgical history. HPI:  Eric Graves is an 72 y.o. male with hx of HTN, CVA with left  side hemiplegia, dysphagia requiring nectar thickened food, HTN,  hx of COPD, brought to the ER as he was having increase shortness  of breath.  He does not communicate, but denied he was having  shortness of breath.     Assessment / Plan / Recommendation Clinical Impression  Dysphagia Diagnosis: Severe oral phase dysphagia;Severe  pharyngeal phase dysphagia Clinical impression: Prior to start of MBS, pt was noted to  exhibit congested breath sounds.  No vocalization elicited.  Pt  exhibits severe oropharyngeal sensory and motor based dysphagia.    Initial 1/2 tsp boluses elicited no swallow reflex.  Pt was noted  to hold bolus orally with absent formation or posterior  propulsion. Presentation of nectar thick liquid bolus resulted in  spillage to pyriform sinuses, and penetration and aspiration  during the swallow with no cough response.   Pt is unable to  follow directions for airway protection.  Remaining oral residue  was suctioned from pt mouth.  Pt is NOT SAFE for po intake at  this time.    Treatment Recommendation  Therapy as outlined in treatment plan below    Diet Recommendation NPO   Liquid Administration via: Cup;No straw;Spoon Medication Administration: Via alternative means Supervision: Full supervision/cueing for compensatory  strategies;Staff to assist with self feeding Compensations: Slow rate;Small sips/bites;Check for pocketing Postural Changes and/or Swallow Maneuvers: Seated upright 90  degrees;Upright 30-60 min after meal    Other  Recommendations Recommended Consults: MBS Oral Care Recommendations: Oral care Q4 per protocol Other Recommendations: Order thickener from pharmacy   Follow Up Recommendations  24 hour supervision/assistance;Skilled Nursing facility    Frequency and Duration min 1 x/week  1 week   Pertinent Vitals/Pain VSS, no pain indicated    SLP Swallow Goals  see care plan   General Date of Onset:  (chronic) HPI: Eric Graves is an 72 y.o. male with hx of HTN, CVA with  left side hemiplegia, dysphagia requiring nectar thickened food,  HTN, hx of COPD, brought to the ER as he was having increase  shortness of breath.  He does not communicate, but denied he was  having shortness of breath. Type of Study: Modified Barium Swallowing Study Reason for Referral: Objectively evaluate swallowing function Previous Swallow Assessment: BSE 02/16/13; OP MBS 09/26/11 with  recommendations fo Dys 3 and nectar thick liquids with  consideration for water protocol Diet Prior to this Study: Regular;Nectar-thick liquids Temperature Spikes  Noted: No Respiratory Status: Nasal cannula History of Recent Intubation: No Behavior/Cognition: Alert;Requires cueing;Doesn't follow  directions Oral Cavity - Dentition: Missing dentition Oral Motor / Sensory Function: Impaired - see Bedside swallow  eval Self-Feeding Abilities: Total assist Patient Positioning: Upright in chair Baseline Vocal Quality: Clear Volitional Cough: Cognitively unable to elicit Volitional Swallow: Unable to elicit Anatomy: Within functional limits Pharyngeal Secretions: Not observed secondary MBS    Reason for Referral Objectively evaluate swallowing function   Oral Phase Oral Preparation/Oral Phase Oral Phase: Impaired Oral - Nectar Oral - Nectar Teaspoon: Weak lingual manipulation;Lingual  pumping;Holding of bolus;Reduced posterior propulsion Oral - Nectar Straw: Weak lingual manipulation;Lingual  pumping;Holding of bolus;Reduced posterior propulsion;Piecemeal  swallowing Oral - Solids Oral - Puree: Weak lingual manipulation;Lingual  pumping;Incomplete tongue to palate contact;Reduced posterior  propulsion;Holding of bolus;Piecemeal swallowing   Pharyngeal Phase Pharyngeal Phase Pharyngeal Phase: Impaired Pharyngeal - Nectar Pharyngeal - Nectar Teaspoon:  (ABSENT SWALLOW) Pharyngeal - Nectar Straw: Penetration/Aspiration during  swallow;Premature spillage to pyriform sinuses;Reduced pharyngeal  peristalsis;Reduced epiglottic inversion;Reduced laryngeal  elevation;Reduced airway/laryngeal closure;Moderate  aspiration;Reduced tongue base retraction;Pharyngeal residue -  valleculae;Pharyngeal residue - posterior pharnyx Penetration/Aspiration details (nectar straw): Material enters  airway, passes BELOW cords without attempt by patient to eject  out (silent aspiration) Pharyngeal - Solids Pharyngeal - Puree:  (ABSENT SWALLOW) Pharyngeal Phase - Comment Pharyngeal Comment: Puree consistency suctioned from oral cavity  Cervical Esophageal Phase    GO  Celia B. Murvin [facility], CCC-SLP [phone]  [phone]   Cervical Esophageal Phase Cervical Esophageal Phase: [facility]         [name] 02/16/2013, 12:58 PM    Medications: Infusions: . dextrose 75 mL/hr at 02/17/13 0747    Scheduled Medications: . antiseptic oral rinse  15 mL Mouth Rinse BID  . cefTRIAXone (ROCEPHIN)  IV  1 g Intravenous Q24H  . Chlorhexidine Gluconate Cloth  6 each Topical Q0600  . heparin  5,000 Units Subcutaneous Q8H  . insulin aspart  0-9 Units Subcutaneous Q4H  . insulin glargine  15 Units Subcutaneous QHS  . metoprolol  5 mg Intravenous Q6H  . mupirocin ointment  1 application Nasal BID  .  sodium chloride  3 mL Intravenous Q12H    have reviewed scheduled and prn medications.  Physical Exam: General: Alert but nonverbal. Heart: Borderline tachycardic and regular Lungs: Coarse breath sounds bilaterally Abdomen: Soft, nontender Extremities: No edema    02/18/2013,11:38 AM  LOS: 3 days

## 2013-02-19 ENCOUNTER — Inpatient Hospital Stay (HOSPITAL_COMMUNITY): Payer: Medicare Other

## 2013-02-19 LAB — CBC
Hemoglobin: 10.4 g/dL — ABNORMAL LOW (ref 13.0–17.0)
MCH: 30.5 pg (ref 26.0–34.0)
MCHC: 31.8 g/dL (ref 30.0–36.0)
Platelets: 289 10*3/uL (ref 150–400)
RBC: 3.41 MIL/uL — ABNORMAL LOW (ref 4.22–5.81)
RDW: 14.6 % (ref 11.5–15.5)

## 2013-02-19 LAB — COMPREHENSIVE METABOLIC PANEL
ALT: 14 U/L (ref 0–53)
AST: 16 U/L (ref 0–37)
BUN: 84 mg/dL — ABNORMAL HIGH (ref 6–23)
CO2: 17 mEq/L — ABNORMAL LOW (ref 19–32)
Calcium: 6.9 mg/dL — ABNORMAL LOW (ref 8.4–10.5)
Chloride: 111 mEq/L (ref 96–112)
Creatinine, Ser: 4.36 mg/dL — ABNORMAL HIGH (ref 0.50–1.35)
GFR calc Af Amer: 14 mL/min — ABNORMAL LOW (ref >90)
GFR calc non Af Amer: 12 mL/min — ABNORMAL LOW (ref >90)
Glucose, Bld: 351 mg/dL — ABNORMAL HIGH (ref 70–99)
Sodium: 141 mEq/L (ref 135–145)
Total Bilirubin: 0.3 mg/dL (ref 0.3–1.2)

## 2013-02-19 LAB — GLUCOSE, CAPILLARY
Glucose-Capillary: 158 mg/dL — ABNORMAL HIGH (ref 70–99)
Glucose-Capillary: 265 mg/dL — ABNORMAL HIGH (ref 70–99)

## 2013-02-19 LAB — RENAL FUNCTION PANEL
Albumin: 1.6 g/dL — ABNORMAL LOW (ref 3.5–5.2)
BUN: 96 mg/dL — ABNORMAL HIGH (ref 6–23)
Creatinine, Ser: 4.81 mg/dL — ABNORMAL HIGH (ref 0.50–1.35)
Glucose, Bld: 142 mg/dL — ABNORMAL HIGH (ref 70–99)
Phosphorus: 3.4 mg/dL (ref 2.3–4.6)

## 2013-02-19 LAB — BLOOD GAS, ARTERIAL
Acid-base deficit: 9.9 mmol/L — ABNORMAL HIGH (ref 0.0–2.0)
Bicarbonate: 14.3 mEq/L — ABNORMAL LOW (ref 20.0–24.0)
Drawn by: 28701
FIO2: 100 %
O2 Saturation: 98.7 %
Patient temperature: 98.6
TCO2: 15.1 mmol/L (ref 0–100)
pH, Arterial: 7.372 (ref 7.350–7.450)

## 2013-02-19 MED ORDER — DILTIAZEM HCL 100 MG IV SOLR
5.0000 mg/h | INTRAVENOUS | Status: DC
  Administered 2013-02-19: 20:00:00 5 mg/h via INTRAVENOUS
  Administered 2013-02-20: 15 mg/h via INTRAVENOUS
  Filled 2013-02-19: qty 100

## 2013-02-19 MED ORDER — METOPROLOL TARTRATE 1 MG/ML IV SOLN
5.0000 mg | Freq: Four times a day (QID) | INTRAVENOUS | Status: DC | PRN
  Administered 2013-02-19 (×3): 2.5 mg via INTRAVENOUS

## 2013-02-19 MED ORDER — PIPERACILLIN-TAZOBACTAM IN DEX 2-0.25 GM/50ML IV SOLN
2.2500 g | Freq: Three times a day (TID) | INTRAVENOUS | Status: DC
  Administered 2013-02-19 – 2013-02-26 (×21): 2.25 g via INTRAVENOUS
  Filled 2013-02-19 (×24): qty 50

## 2013-02-19 MED ORDER — LEVALBUTEROL HCL 0.63 MG/3ML IN NEBU: 0.6300 mg | INHALATION_SOLUTION | Freq: Four times a day (QID) | RESPIRATORY_TRACT | Status: DC | PRN

## 2013-02-19 MED ORDER — VANCOMYCIN HCL IN DEXTROSE 1-5 GM/200ML-% IV SOLN
1000.0000 mg | INTRAVENOUS | Status: AC
  Administered 2013-02-19: 1000 mg via INTRAVENOUS
  Filled 2013-02-19: qty 200

## 2013-02-19 MED ORDER — ACETAMINOPHEN 650 MG RE SUPP: 650.0000 mg | RECTAL | Status: DC | PRN

## 2013-02-19 MED ORDER — CLOPIDOGREL BISULFATE 75 MG PO TABS
75.0000 mg | ORAL_TABLET | Freq: Every day | ORAL | Status: DC
  Administered 2013-02-19: 75 mg via ORAL
  Filled 2013-02-19 (×2): qty 1

## 2013-02-19 MED ORDER — ASPIRIN 300 MG RE SUPP
300.0000 mg | Freq: Every day | RECTAL | Status: DC
  Administered 2013-02-19 – 2013-02-27 (×9): 300 mg via RECTAL
  Filled 2013-02-19 (×9): qty 1

## 2013-02-19 MED ORDER — GABAPENTIN 400 MG PO CAPS
400.0000 mg | ORAL_CAPSULE | Freq: Every day | ORAL | Status: DC
  Administered 2013-02-19: 17:00:00 400 mg via ORAL
  Filled 2013-02-19: qty 1

## 2013-02-19 MED ORDER — INSULIN ASPART 100 UNIT/ML ~~LOC~~ SOLN
0.0000 [IU] | SUBCUTANEOUS | Status: DC
  Administered 2013-02-20: 3 [IU] via SUBCUTANEOUS
  Administered 2013-02-20: 2 [IU] via SUBCUTANEOUS
  Administered 2013-02-20 (×2): 7 [IU] via SUBCUTANEOUS
  Administered 2013-02-20: 5 [IU] via SUBCUTANEOUS
  Administered 2013-02-21 (×2): 2 [IU] via SUBCUTANEOUS
  Administered 2013-02-22 – 2013-02-23 (×7): 1 [IU] via SUBCUTANEOUS
  Administered 2013-02-23: 2 [IU] via SUBCUTANEOUS
  Administered 2013-02-24 – 2013-02-26 (×4): 1 [IU] via SUBCUTANEOUS

## 2013-02-19 MED ORDER — ATORVASTATIN CALCIUM 20 MG PO TABS
20.0000 mg | ORAL_TABLET | Freq: Every day | ORAL | Status: DC
  Administered 2013-02-19: 17:00:00 20 mg via ORAL
  Filled 2013-02-19: qty 1

## 2013-02-19 MED ORDER — CARVEDILOL 6.25 MG PO TABS
6.2500 mg | ORAL_TABLET | Freq: Two times a day (BID) | ORAL | Status: DC
  Administered 2013-02-19: 6.25 mg via ORAL
  Filled 2013-02-19 (×2): qty 1

## 2013-02-19 MED ORDER — HYDRALAZINE HCL 20 MG/ML IJ SOLN
10.0000 mg | Freq: Four times a day (QID) | INTRAMUSCULAR | Status: DC | PRN
  Administered 2013-02-27: 10 mg via INTRAVENOUS
  Filled 2013-02-19: qty 1

## 2013-02-19 NOTE — Progress Notes (Signed)
Pt keep on npo as ordered. Repositioned q 2 hrs. Sacral dsg  Changed. Pt had moderate amt of BM soft, brownish in color

## 2013-02-19 NOTE — Progress Notes (Signed)
Addendum  Paged by patient's nurse at approximately 6 PM indicating that patient was in rapid A. Fib with heart rates of 170s. Blood pressures which were initially normal dropped to systolic of 90s. Rapid response was activated.  On arrival to the bedside, rapid response nurse reported that patient's lungs were extremely wet sounding. Orotracheal suctioning done and significant amount of returns suggesting feed aspiration. Respiratory status improved post suctioning and oxygen. Patient continues to be in rapid A. Fib with rates in the 170s. Systolic blood pressure in the 80s. Febrile with rectal temperature of 100.67F. Patient appears pale and clammy. Somnolent but easily arousable. Metoprolol 2.5 mg x3 doses tried without significant improvement in heart rate. Lasix 20 mg x1 dose given for possible pulmonary edema. Chest x-ray suggests left base opacity which may represent aspiration pneumonia, Chronic interstitial lung disease and left effusion. No echo in system. EKG shows A. Fib with RVR in the 170s, diffuse ST depression-probably rate related. Discussed at length via phone with patient's son Mr. Brysyn Brandenberger who states that he wishes to continue aggressive treatment short of DO NOT RESUSCITATE/DO NOT INTUBATE, including transfer to step down, treatment for pneumonia and rapid A. Fib. Explained in detail to Mr. Willmon that patient is critically ill and could potentially decline and demise despite aggressive measures. He verbalized understanding.  Assessment and plan 1. Aspiration pneumonia: Following initiation of modified feeds today. Change to n.p.o. Status. Orotracheal suctioning when necessary and pulmonary toilet. Broaden antibiotic spectrum IV Zosyn and vancomycin.Blood cultures x2. 2. A. Fib with RVR: Likely precipitated with acute respiratory event. Transfer to step down. Start IV Cardizem and titrate. Check 2-D echo. Cycle troponins. 3. Hypotension: Likely secondary to tachycardia but also  may be due to aspiration pneumonia and sepsis. 4. Acute hypoxic respiratory failure: Most likely secondary to aspiration pneumonia and may have an element of CHF-unknown type (no prior echo). Management as above. Provided a dose of Lasix. Reduce IV fluids.  Marcellus Scott, MD, FACP, FHM. [facility] Pager [phone]  If 7PM-7AM, please contact night-coverage [email or web address] Password [facility] 02/19/2013, 7:35 PM

## 2013-02-19 NOTE — Progress Notes (Signed)
ANTIBIOTIC CONSULT NOTE - INITIAL  Pharmacy Consult for Vancomycin and Zosyn Indication: pneumonia  No Known Allergies  Patient Measurements: Height: 5\' 5"  (165.1 cm) Weight: 145 lb 6.4 oz (65.953 kg) (bed scale) IBW/kg (Calculated) : 61.5 Adjusted Body Weight:   Vital Signs: Temp: 99.5 F (37.5 C) (12/26 1806) Temp src: Axillary (12/26 1806) BP: 90/58 mmHg (12/26 1924) Pulse Rate: 79 (12/26 1806) Intake/Output from previous day: 12/25 0701 - 12/26 0700 In: 2664.6 [I.V.:2614.6; IV Piggyback:50] Out: 350 [Urine:350] Intake/Output from this shift:    Labs:  Recent Labs  02/17/13 0543 02/17/13 2020 02/18/13 0915 02/19/13 0407  WBC 7.7  --  13.2*  --   HGB 10.6*  --  11.5*  --   PLT 298  --  282  --   CREATININE 6.20* 5.68* 5.45* 4.81*   Estimated Creatinine Clearance: 11.9 ml/min (by C-G formula based on Cr of 4.81). No results found for this basename: VANCOTROUGH, Leodis Binet, VANCORANDOM, GENTTROUGH, GENTPEAK, GENTRANDOM, TOBRATROUGH, TOBRAPEAK, TOBRARND, AMIKACINPEAK, AMIKACINTROU, AMIKACIN,  in the last 72 hours   Microbiology: Recent Results (from the past 720 hour(s))  URINE CULTURE     Status: None   Collection Time    02/15/13  8:30 PM      Result Value Range Status   Specimen Description URINE, CATHETERIZED   Final   Special Requests NONE   Final   Culture  Setup Time     Final   Value: 02/16/2013 01:51     Performed at [facility] Count     Final   Value: NO GROWTH     Performed at [facility]   Culture     Final   Value: NO GROWTH     Performed at [facility]   Report Status 02/16/2013 FINAL   Final  CULTURE, BLOOD (ROUTINE X 2)     Status: None   Collection Time    02/15/13  8:55 PM      Result Value Range Status   Specimen Description BLOOD ARM RIGHT   Final   Special Requests BOTTLES DRAWN AEROBIC AND ANAEROBIC 10CC   Final   Culture  Setup Time     Final   Value: 02/16/2013 04:12     Performed at [facility]   Culture     Final   Value:        BLOOD CULTURE RECEIVED NO GROWTH TO DATE CULTURE WILL BE HELD FOR 5 DAYS BEFORE ISSUING A FINAL NEGATIVE REPORT     Performed at [facility]   Report Status PENDING   Incomplete  CULTURE, BLOOD (ROUTINE X 2)     Status: None   Collection Time    02/15/13  9:05 PM      Result Value Range Status   Specimen Description BLOOD ARM LEFT   Final   Special Requests BOTTLES DRAWN AEROBIC ONLY 10CC   Final   Culture  Setup Time     Final   Value: 02/16/2013 04:12     Performed at [facility]   Culture     Final   Value:        BLOOD CULTURE RECEIVED NO GROWTH TO DATE CULTURE WILL BE HELD FOR 5 DAYS BEFORE ISSUING A FINAL NEGATIVE REPORT     Performed at [facility]   Report Status PENDING   Incomplete  MRSA PCR SCREENING     Status: Abnormal   Collection Time  02/16/13  1:08 AM      Result Value Range Status   MRSA by PCR POSITIVE (*) NEGATIVE Final   Comment:            The GeneXpert MRSA Assay (FDA     approved for NASAL specimens     only), is one component of a     comprehensive MRSA colonization     surveillance program. It is not     intended to diagnose MRSA     infection nor to guide or     monitor treatment for     MRSA infections.     RESULT CALLED TO, READ BACK BY AND VERIFIED WITH:     HARAWAY,J,RN 0230 02/16/13 MITCHELL,L    Medical History: Past Medical History  Diagnosis Date  . Hyperlipidemia   . Diabetes mellitus without complication   . Anemia   . Hypertension   . Stroke   . Arthritis   . Dysphagia   . Vitamin D deficiency   . Depressive disorder, not elsewhere classified   . Unspecified hereditary and idiopathic peripheral neuropathy   . 9 completed weeks of gestation   . Malignant hypertensive heart/renal dis   . COPD (chronic obstructive pulmonary disease)     Medications:  Scheduled:  . antiseptic oral rinse  15 mL Mouth Rinse BID  . Chlorhexidine Gluconate Cloth  6 each  Topical Q0600  . heparin  5,000 Units Subcutaneous Q8H  . insulin glargine  15 Units Subcutaneous QHS  . mupirocin ointment  1 application Nasal BID  . sodium chloride  3 mL Intravenous Q12H   Assessment: 72yo male with CXR(+) aspiration pneumonia vs HCAP, on Rocephin now to change to Vancomycin and Zosyn.  Pt with acute on chronic kidney failure and improving Cr, though remains elevated at 4.81.  Calculated CrCl 12 ml/min.  Blood cx from 12/22 are NTD, another set of blood cx have been ordered.  Goal of Therapy:  Vancomycin trough level 15-20 mcg/ml  Plan:  1.  Vancomycin 1000mg  IV x 1 2.  Random Vancomycin level in 48hr 3.  Zosyn 2.25gm IV q8 4.  Watch renal function closely 5.  F/U blood cultures  Marisue Humble, PharmD Clinical Pharmacist [facility]

## 2013-02-19 NOTE — Progress Notes (Signed)
02/19/13 At approximately 1745, notified by Central telemetry that patient was sustaining heart rate in the 180s, notified MD of findings, patient experiencing some respiratory distress Rapid response notified, stat EKG ordered along with orders for beta blockers,  AFIB with RVR per EKG.   MD to floor to assess patient, ordered to transfer patient to stepdown unit on cardizem drip.  Will continue to monitor patient until patient transferred to step down unit.  Anastasia Fiedler RN.

## 2013-02-19 NOTE — Progress Notes (Addendum)
[facility] PROGRESS NOTE    Eric Graves WUJ:[id number] DOB: Jun 03, 1940 DOA: 02/15/2013 PCP: Bufford Spikes, DO  HPI/Brief narrative 72 y.o. male with hx of HTN, CVA with left side hemiplegia, dysphagia requiring nectar thickened food, HTN, hx of COPD, brought to the ER as he was having increase shortness of breath. He does not communicate and denied he was having shortness of breath. Evalaution in the ER showed CXR with mild vascular congestion, AKI with Cr of 6.64 and BUN of 132. His baseline Cr was 1.7. He has a WBC of 13K, a Na of 155, and normal LFTs. Hospitalist was asked to admit him for AKI. He was started on VAN/Zosyn for ? PNA.  Assessment/Plan:  Acute on chronic renal failure - Likely secondary to volume depletion - Creatinine slowly improving - Baseline creatinine in H&P was recorded as 1.7 but unsure as to when and where it was obtained from. - Continue D5 water at 125 mL per hour. He seems to be tolerating IV fluids without any worsening respiratory status. Continues to make urine. - Nephrology consultation & followup appreciated. - Follow BMP in a.m.  Hypernatremic dehydration - Continue D5 water-increased to 125 miles per hour. Sodium slowly improving.. - Starting diet per speech therapy, with family aware of aspiration risks.  Anemia - Possibly chronic.  stable   Dysphagia - Diet per speech therapy eval.  Type II DM - Reasonably controlled. However due to n.p.o. status, change SSI 2 every 4 hours.  Presumed pneumonia-left lower lobe/basilar  Chest x-ray however  initially did not show pneumonia. Followup chest x-ray tomorrow  suggest possible pneumonia-probably aspiration . If patient's respiratory status declines, consider BiPAP. Continue IV Rocephin.no UTI-urine cultures negative. Will transition to oral antibiotics in a.m.    Failure to thrive - Palliative care team input appreciated.  Hypertension: -Resume carvedilol.   Code Status: DO NOT  RESUSCITATE  Family Communication: Discussed with son via phone  on 12/24.  Disposition Plan: To be determined    Consultants:  Palliative care team    nephrology  Procedures:  None   Antibiotics:  IV Rocephin    Subjective: Patient denies dyspnea..  Objective: Filed Vitals:   02/19/13 0440 02/19/13 0536 02/19/13 0900 02/19/13 1300  BP:  159/73 171/68 180/80  Pulse:  73 92 80  Temp:   98 F (36.7 C) 98.1 F (36.7 C)  TempSrc:   Oral Oral  Resp:   20 20  Height:      Weight: 65.953 kg (145 lb 6.4 oz)     SpO2:   97% 100%    Intake/Output Summary (Last 24 hours) at 02/19/13 1528 Last data filed at 02/19/13 1416  Gross per 24 hour  Intake 2414.58 ml  Output    650 ml  Net 1764.58 ml   Filed Weights   02/17/13 0348 02/18/13 0510 02/19/13 0440  Weight: 66.9 kg (147 lb 7.8 oz) 66.679 kg (147 lb) 65.953 kg (145 lb 6.4 oz)     Exam:  General exam: Chronically ill looking elderly male lying propped up in bed in no obvious distress  Respiratory system:   Breath sounds bilateral fair and clear to auscultation. No wheezing or rhonchi  . No increased work of breathing. Cardiovascular system: S1 & S2 heard, RRR. No JVD, murmurs, gallops, clicks or pedal edema. telemetry: Sinus rhythm. Gastrointestinal system: Abdomen is nondistended, soft and nontender. Normal bowel sounds heard. Central nervous system: Alert and oriented to self and obeys some commands . No focal  neurological deficits. Extremities: Symmetric 4x 5 power.   Data Reviewed: Basic Metabolic Panel:  Recent Labs Lab 02/16/13 0450 02/17/13 0543 02/17/13 2020 02/18/13 0915 02/19/13 0407  NA 154* 163* 158* 160* 155*  K 4.5 3.7 3.2* 3.3* 3.5  CL 118* 128* 125* 125* 123*  CO2 20 18* 19 21 19   GLUCOSE 244* 163* 143* 180* 142*  BUN 133* 122* 111* 108* 96*  CREATININE 6.62* 6.20* 5.68* 5.45* 4.81*  CALCIUM 7.7* 7.6* 7.5* 7.4* 7.4*  PHOS  --  4.3  --   --  3.4   Liver Function Tests:  Recent  Labs Lab 02/15/13 1908 02/17/13 0543 02/19/13 0407  AST 31  --   --   ALT 24  --   --   ALKPHOS 76  --   --   BILITOT 0.4  --   --   PROT 6.0  --   --   ALBUMIN 2.0* 1.8* 1.6*   No results found for this basename: LIPASE, AMYLASE,  in the last 168 hours No results found for this basename: AMMONIA,  in the last 168 hours CBC:  Recent Labs Lab 02/15/13 1908 02/16/13 0450 02/17/13 0543 02/18/13 0915  WBC 13.5* 9.8 7.7 13.2*  NEUTROABS 10.1*  --   --   --   HGB 11.9* 11.4* 10.6* 11.5*  HCT 38.0* 36.2* 33.3* 36.4*  MCV 97.4 96.3 94.9 96.0  PLT 235 236 298 282   Cardiac Enzymes:  Recent Labs Lab 02/15/13 1908  TROPONINI <0.30   BNP (last 3 results)  Recent Labs  02/15/13 1908  PROBNP 987.2*   CBG:  Recent Labs Lab 02/18/13 1204 02/18/13 1622 02/18/13 2016 02/19/13 0005 02/19/13 1222  GLUCAP 148* 145* 129* 158* 213*    Recent Results (from the past 240 hour(s))  URINE CULTURE     Status: None   Collection Time    02/15/13  8:30 PM      Result Value Range Status   Specimen Description URINE, CATHETERIZED   Final   Special Requests NONE   Final   Culture  Setup Time     Final   Value: 02/16/2013 01:51     Performed at [facility] Count     Final   Value: NO GROWTH     Performed at [facility]   Culture     Final   Value: NO GROWTH     Performed at [facility]   Report Status 02/16/2013 FINAL   Final  CULTURE, BLOOD (ROUTINE X 2)     Status: None   Collection Time    02/15/13  8:55 PM      Result Value Range Status   Specimen Description BLOOD ARM RIGHT   Final   Special Requests BOTTLES DRAWN AEROBIC AND ANAEROBIC 10CC   Final   Culture  Setup Time     Final   Value: 02/16/2013 04:12     Performed at [facility]   Culture     Final   Value:        BLOOD CULTURE RECEIVED NO GROWTH TO DATE CULTURE WILL BE HELD FOR 5 DAYS BEFORE ISSUING A FINAL NEGATIVE REPORT     Performed at [facility]    Report Status PENDING   Incomplete  CULTURE, BLOOD (ROUTINE X 2)     Status: None   Collection Time    02/15/13  9:05 PM      Result Value  Range Status   Specimen Description BLOOD ARM LEFT   Final   Special Requests BOTTLES DRAWN AEROBIC ONLY 10CC   Final   Culture  Setup Time     Final   Value: 02/16/2013 04:12     Performed at [facility]   Culture     Final   Value:        BLOOD CULTURE RECEIVED NO GROWTH TO DATE CULTURE WILL BE HELD FOR 5 DAYS BEFORE ISSUING A FINAL NEGATIVE REPORT     Performed at [facility]   Report Status PENDING   Incomplete  MRSA PCR SCREENING     Status: Abnormal   Collection Time    02/16/13  1:08 AM      Result Value Range Status   MRSA by PCR POSITIVE (*) NEGATIVE Final   Comment:            The GeneXpert MRSA Assay (FDA     approved for NASAL specimens     only), is one component of a     comprehensive MRSA colonization     surveillance program. It is not     intended to diagnose MRSA     infection nor to guide or     monitor treatment for     MRSA infections.     RESULT CALLED TO, READ BACK BY AND VERIFIED WITH:     HARAWAY,J,RN 0230 02/16/13 MITCHELL,L         Studies: Dg Chest 2 View  02/18/2013   CLINICAL DATA:  Short of breath.  Congestion.  EXAM: CHEST  2 VIEW  COMPARISON:  02/15/2013  FINDINGS: There are low lung volumes. There are irregularly thickened bronchovascular markings bilaterally, stable. Or focal opacity is evident in the posterior lung base on the lateral view. A left lower lobe, or possibly by basilar, infiltrate should be suspected in the proper clinical setting.  No pleural effusion or pneumothorax.  The cardiac silhouette is normal in size. The mediastinum is normal in contour.  IMPRESSION: 1. Findings are similar to the recent prior exam. 2. Possible left lower lobe, or bibasilar, infiltrates.   Electronically Signed   By: Amie Portland M.D.   On: 02/18/2013 09:10        Scheduled Meds: .  antiseptic oral rinse  15 mL Mouth Rinse BID  . cefTRIAXone (ROCEPHIN)  IV  1 g Intravenous Q24H  . Chlorhexidine Gluconate Cloth  6 each Topical Q0600  . heparin  5,000 Units Subcutaneous Q8H  . insulin aspart  0-9 Units Subcutaneous Q4H  . insulin glargine  15 Units Subcutaneous QHS  . metoprolol  5 mg Intravenous Q6H  . mupirocin ointment  1 application Nasal BID  . sodium chloride  3 mL Intravenous Q12H   Continuous Infusions: . dextrose 125 mL/hr at 02/19/13 1204    Principal Problem:   Renal failure, acute on chronic Active Problems:   Type II or unspecified type diabetes mellitus with neurological manifestations, not stated as uncontrolled(250.60)   Essential hypertension, benign   Other dysphagia   CVA (cerebral vascular accident)   COPD (chronic obstructive pulmonary disease)   AKI (acute kidney injury)   CHF (congestive heart failure)   DNR (do not resuscitate)   Dehydration with hypernatremia   Dysphagia, unspecified(787.20)    Time spent: 25 minutes    Jonnatan Hanners, MD, FACP, FHM. [facility] Pager [phone]  If 7PM-7AM, please contact night-coverage [email or web address] Password [facility] 02/19/2013, 3:28 PM    LOS:  4 days

## 2013-02-19 NOTE — Progress Notes (Signed)
Discussed findings of this am's speech therapy session with pt's son Macky Galik. SLP informed him that pt can consume pureed diet and nectar thick liquids though risk of aspiration will be high. Mr. Graham verbalized understanding of risk and affirmed that he wants the pt to eat despite risk. Pt is recommended to consume Dys 1/nectar thick liquids with full supervision. Will write diet per MD. Harlon Ditty, MA CCC-SLP [phone]

## 2013-02-19 NOTE — Significant Event (Signed)
Rapid Response Event Note  Overview: Called to assist with patient with elevated HR Time Called: 1835 Arrival Time: 1840 Event Type: Respiratory;Cardiac  Initial Focused Assessment:  On arrival patient supine in bed - resp distress - using accessory muscles - almost agonal resps - audible Rhonchi heard - skin hot and moist - pale - HR afib with RVR on monitor rate 183 - patient non-verbal at baseline - opens eyes to name - bil BS very coarse - O2 sats 80% on 5 liter nasal cannula.  Manual BP 80/42.  Foley patent - cloudy yellow urine.    Interventions:  HOB elevated - placed on NRB mask - NTS'd large amounts thick purulent secretions - weak cough - Stat 12 lead shows afib with RVR - called for stat PCXR - Dr. Waymon Amato on phone - update given he is on way to see patient - Post suction and NRB mask patient color better but still very pale.  Rectal temp 100.9.  PCXR done - Dr. Waymon Amato present - HR remains rapid - manual BP 82/42 1905:  Lopressor 2.5 mg IV given.  1910:  BP 88/48 HR 163 Lopressor 2.5 mg IV given.  Dr. Waymon Amato speaking with family on phone with update - patient is currently DNR but family wants full medical treatment but no CPR. 1949:  Cardizem 10 mg IV bolus given - BP 88/57. Family here - Dr. Waymon Amato updating them.  2000: Bp 92/48 HR 149 - 10 mg Cardizem bolus repeated - Elray Mcgregor NP here.  ABG done per order MD.   2006:  Transported to 3S03 with monitor and cardizem drip and NRB mask. Color more pink - coughing = remains weak.  BP 88/50 HR 135-150.  Handoff to [employer].     Event Summary: Name of Physician Notified: Dr. Waymon Amato at  (pta)    at    Outcome: Transferred (Comment)     Moshe Cipro, Leotis Pain

## 2013-02-19 NOTE — Progress Notes (Signed)
Speech Language Pathology Treatment: Dysphagia  Patient Details Name: Love Milbourne MRN: [id number] DOB: 1940-08-05 Today's Date: 02/19/2013 Time: 4098-1191 SLP Time Calculation (min): 44 min  Assessment / Plan / Recommendation Clinical Impression  Plan is for pt to begin PO or discuss long term alternate nutrition as family wishes for ongoing care following palliative consult. Discussed probable high aspiration risk with MD with any texture given multitude of co morbidities (chronic dysphagia, old CVA, cognitive deficits, respiratory impairment). MD is aware and would like SLP to assess for best texture with known risk and discuss with family.  Today pt is more automatic with PO intake following aggressive oral care (removal of top denture and caked mucous and blood on soft palate). Oral holding decreased as session progressed. Persistent cough present with thin liquids, but single straw sips of nectar thick liquids are consumed without immediate cough. Pt with excessive oral residual with soft solids. Recommend puree and nectar thick liquids, will discuss with son Toron Bowring prior to writing diet per MD. CAlled son and left message. Please page me at [phone] when then son contacts RN. Thanks!    HPI HPI: Rishi Vicario is an 72 y.o. male with hx of HTN, CVA with left side hemiplegia, dysphagia requiring nectar thickened food, HTN, hx of COPD, brought to the ER as he was having increase shortness of breath.  He does not communicate, but denied he was having shortness of breath.   Pertinent Vitals NA  SLP Plan  Goals updated    Recommendations Diet recommendations: Dysphagia 1 (puree);Nectar-thick liquid Liquids provided via: Cup;Straw Medication Administration: Crushed with puree Supervision: Full supervision/cueing for compensatory strategies;Staff to assist with self feeding Compensations: Slow rate;Small sips/bites;Check for pocketing Postural Changes and/or Swallow Maneuvers:  Seated upright 90 degrees;Upright 30-60 min after meal              Oral Care Recommendations: Oral care before and after PO Follow up Recommendations: 24 hour supervision/assistance;Skilled Nursing facility Plan: Goals updated    GO    Harlon Ditty, MA CCC-SLP [phone]  Claudine Mouton 02/19/2013, 10:27 AM

## 2013-02-19 NOTE — Progress Notes (Signed)
[facility] ROUNDING NOTE   Subjective:   Interval History: aphasic paralysis   Objective:  Vital signs in last 24 hours:  Temp:  [97.4 F (36.3 C)-98 F (36.7 C)] 98 F (36.7 C) (12/26 0900) Pulse Rate:  [16-92] 92 (12/26 0900) Resp:  [20-22] 20 (12/26 0900) BP: (155-171)/(52-73) 171/68 mmHg (12/26 0900) SpO2:  [93 %-97 %] 97 % (12/26 0900) Weight:  [65.953 kg (145 lb 6.4 oz)] 65.953 kg (145 lb 6.4 oz) (12/26 0440)  Weight change: -0.726 kg (-1 lb 9.6 oz) Filed Weights   02/17/13 0348 02/18/13 0510 02/19/13 0440  Weight: 66.9 kg (147 lb 7.8 oz) 66.679 kg (147 lb) 65.953 kg (145 lb 6.4 oz)    Intake/Output: I/O last 3 completed shifts: In: 3035.8 [I.V.:2935.8; IV Piggyback:100] Out: 975 [Urine:975]   Intake/Output this shift:     CVS- RRR RS- CTA ABD- BS present soft non-distended EXT- no edema   Basic Metabolic Panel:  Recent Labs Lab 02/16/13 0450 02/17/13 0543 02/17/13 2020 02/18/13 0915 02/19/13 0407  NA 154* 163* 158* 160* 155*  K 4.5 3.7 3.2* 3.3* 3.5  CL 118* 128* 125* 125* 123*  CO2 20 18* 19 21 19   GLUCOSE 244* 163* 143* 180* 142*  BUN 133* 122* 111* 108* 96*  CREATININE 6.62* 6.20* 5.68* 5.45* 4.81*  CALCIUM 7.7* 7.6* 7.5* 7.4* 7.4*  PHOS  --  4.3  --   --  3.4    Liver Function Tests:  Recent Labs Lab 02/15/13 1908 02/17/13 0543 02/19/13 0407  AST 31  --   --   ALT 24  --   --   ALKPHOS 76  --   --   BILITOT 0.4  --   --   PROT 6.0  --   --   ALBUMIN 2.0* 1.8* 1.6*   No results found for this basename: LIPASE, AMYLASE,  in the last 168 hours No results found for this basename: AMMONIA,  in the last 168 hours  CBC:  Recent Labs Lab 02/15/13 1908 02/16/13 0450 02/17/13 0543 02/18/13 0915  WBC 13.5* 9.8 7.7 13.2*  NEUTROABS 10.1*  --   --   --   HGB 11.9* 11.4* 10.6* 11.5*  HCT 38.0* 36.2* 33.3* 36.4*  MCV 97.4 96.3 94.9 96.0  PLT 235 236 298 282    Cardiac Enzymes:  Recent Labs Lab 02/15/13 1908   TROPONINI <0.30    BNP: No components found with this basename: POCBNP,   CBG:  Recent Labs Lab 02/18/13 0809 02/18/13 1204 02/18/13 1622 02/18/13 2016 02/19/13 0005  GLUCAP 163* 148* 145* 129* 158*    Microbiology: Results for orders placed during the hospital encounter of 02/15/13  URINE CULTURE     Status: None   Collection Time    02/15/13  8:30 PM      Result Value Range Status   Specimen Description URINE, CATHETERIZED   Final   Special Requests NONE   Final   Culture  Setup Time     Final   Value: 02/16/2013 01:51     Performed at [facility] Count     Final   Value: NO GROWTH     Performed at [facility]   Culture     Final   Value: NO GROWTH     Performed at [facility]   Report Status 02/16/2013 FINAL   Final  CULTURE, BLOOD (ROUTINE X 2)     Status: None  Collection Time    02/15/13  8:55 PM      Result Value Range Status   Specimen Description BLOOD ARM RIGHT   Final   Special Requests BOTTLES DRAWN AEROBIC AND ANAEROBIC 10CC   Final   Culture  Setup Time     Final   Value: 02/16/2013 04:12     Performed at [facility]   Culture     Final   Value:        BLOOD CULTURE RECEIVED NO GROWTH TO DATE CULTURE WILL BE HELD FOR 5 DAYS BEFORE ISSUING A FINAL NEGATIVE REPORT     Performed at [facility]   Report Status PENDING   Incomplete  CULTURE, BLOOD (ROUTINE X 2)     Status: None   Collection Time    02/15/13  9:05 PM      Result Value Range Status   Specimen Description BLOOD ARM LEFT   Final   Special Requests BOTTLES DRAWN AEROBIC ONLY 10CC   Final   Culture  Setup Time     Final   Value: 02/16/2013 04:12     Performed at [facility]   Culture     Final   Value:        BLOOD CULTURE RECEIVED NO GROWTH TO DATE CULTURE WILL BE HELD FOR 5 DAYS BEFORE ISSUING A FINAL NEGATIVE REPORT     Performed at [facility]   Report Status PENDING   Incomplete  MRSA PCR SCREENING      Status: Abnormal   Collection Time    02/16/13  1:08 AM      Result Value Range Status   MRSA by PCR POSITIVE (*) NEGATIVE Final   Comment:            The GeneXpert MRSA Assay (FDA     approved for NASAL specimens     only), is one component of a     comprehensive MRSA colonization     surveillance program. It is not     intended to diagnose MRSA     infection nor to guide or     monitor treatment for     MRSA infections.     RESULT CALLED TO, READ BACK BY AND VERIFIED WITH:     HARAWAY,J,RN 0230 02/16/13 MITCHELL,L    Coagulation Studies: No results found for this basename: LABPROT, INR,  in the last 72 hours  Urinalysis: No results found for this basename: COLORURINE, APPERANCEUR, LABSPEC, PHURINE, GLUCOSEU, HGBUR, BILIRUBINUR, KETONESUR, PROTEINUR, UROBILINOGEN, NITRITE, LEUKOCYTESUR,  in the last 72 hours    Imaging: Dg Chest 2 View  02/18/2013   CLINICAL DATA:  Short of breath.  Congestion.  EXAM: CHEST  2 VIEW  COMPARISON:  02/15/2013  FINDINGS: There are low lung volumes. There are irregularly thickened bronchovascular markings bilaterally, stable. Or focal opacity is evident in the posterior lung base on the lateral view. A left lower lobe, or possibly by basilar, infiltrate should be suspected in the proper clinical setting.  No pleural effusion or pneumothorax.  The cardiac silhouette is normal in size. The mediastinum is normal in contour.  IMPRESSION: 1. Findings are similar to the recent prior exam. 2. Possible left lower lobe, or bibasilar, infiltrates.   Electronically Signed   By: Amie Portland M.D.   On: 02/18/2013 09:10     Medications:   . dextrose 125 mL/hr at 02/19/13 0133   . antiseptic oral rinse  15 mL Mouth Rinse  BID  . cefTRIAXone (ROCEPHIN)  IV  1 g Intravenous Q24H  . Chlorhexidine Gluconate Cloth  6 each Topical Q0600  . heparin  5,000 Units Subcutaneous Q8H  . insulin aspart  0-9 Units Subcutaneous Q4H  . insulin glargine  15 Units Subcutaneous  QHS  . metoprolol  5 mg Intravenous Q6H  . mupirocin ointment  1 application Nasal BID  . sodium chloride  3 mL Intravenous Q12H   albuterol, ondansetron (ZOFRAN) IV, ondansetron, RESOURCE THICKENUP CLEAR  Assessment/ Plan:  72 year old white male with severe CVA limiting oral intake who presents with what appears to be volume completion and acute on chronic renal failure with hypernatremia.   1.Renal- His acute on chronic renal failure presentation seems to be due to volume depletion. It has improved slightly with hydration and he actually has a reasonable urine output. His renal ultrasound is consistent with chronic kidney disease. He has a baseline creatinine of 1.7 although I'm not sure when that was. It was recent, then I would suspect he has a reasonable expectation to continue to improve his renal function with gentle hydration. If, however it was quite some time ago like a year, then we may not be as fortunate. He has no acute indications for dialysis at this time. I agree he would be a marginal candidate for it.  2. Hypertension/volume - sodium improved with water.  3. Pulmonary- it is entirely possible that he could be aspirating given his issues. He is on empiric antibiotics. I appreciate the input from palliative care. Patient is a DO NOT INTUBATE /DO NOT RESUSCITATE.  4. Anemia - Is stable  5. Hypokalemia - improved     LOS: 4 Eric Graves W @TODAY @11 :34 AM

## 2013-02-20 ENCOUNTER — Inpatient Hospital Stay (HOSPITAL_COMMUNITY): Payer: Medicare Other

## 2013-02-20 ENCOUNTER — Encounter (HOSPITAL_COMMUNITY): Payer: Self-pay | Admitting: *Deleted

## 2013-02-20 DIAGNOSIS — J690 Pneumonitis due to inhalation of food and vomit: Secondary | ICD-10-CM | POA: Diagnosis present

## 2013-02-20 DIAGNOSIS — J9601 Acute respiratory failure with hypoxia: Secondary | ICD-10-CM | POA: Diagnosis present

## 2013-02-20 DIAGNOSIS — R1319 Other dysphagia: Secondary | ICD-10-CM

## 2013-02-20 DIAGNOSIS — J9600 Acute respiratory failure, unspecified whether with hypoxia or hypercapnia: Secondary | ICD-10-CM

## 2013-02-20 DIAGNOSIS — I4891 Unspecified atrial fibrillation: Secondary | ICD-10-CM

## 2013-02-20 LAB — RENAL FUNCTION PANEL
Albumin: 1.3 g/dL — ABNORMAL LOW (ref 3.5–5.2)
CO2: 16 mEq/L — ABNORMAL LOW (ref 19–32)
Calcium: 6.7 mg/dL — ABNORMAL LOW (ref 8.4–10.5)
Creatinine, Ser: 4.66 mg/dL — ABNORMAL HIGH (ref 0.50–1.35)
GFR calc Af Amer: 13 mL/min — ABNORMAL LOW (ref >90)
GFR calc non Af Amer: 11 mL/min — ABNORMAL LOW (ref >90)
Sodium: 142 mEq/L (ref 135–145)

## 2013-02-20 LAB — GLUCOSE, CAPILLARY
Glucose-Capillary: 117 mg/dL — ABNORMAL HIGH (ref 70–99)
Glucose-Capillary: 160 mg/dL — ABNORMAL HIGH (ref 70–99)
Glucose-Capillary: 231 mg/dL — ABNORMAL HIGH (ref 70–99)
Glucose-Capillary: 302 mg/dL — ABNORMAL HIGH (ref 70–99)
Glucose-Capillary: 309 mg/dL — ABNORMAL HIGH (ref 70–99)

## 2013-02-20 LAB — TROPONIN I
Troponin I: 0.38 ng/mL (ref <0.30)
Troponin I: 0.58 ng/mL (ref <0.30)
Troponin I: 0.7 ng/mL (ref <0.30)

## 2013-02-20 LAB — LACTIC ACID, PLASMA: Lactic Acid, Venous: 1.6 mmol/L (ref 0.5–2.2)

## 2013-02-20 MED ORDER — DEXTROSE 5 % IV SOLN
120.0000 mg | Freq: Once | INTRAVENOUS | Status: AC
  Administered 2013-02-20: 120 mg via INTRAVENOUS
  Filled 2013-02-20: qty 12

## 2013-02-20 MED ORDER — METOPROLOL TARTRATE 1 MG/ML IV SOLN
2.5000 mg | INTRAVENOUS | Status: DC | PRN
  Administered 2013-02-20: 2.5 mg via INTRAVENOUS
  Filled 2013-02-20 (×2): qty 5

## 2013-02-20 MED ORDER — METOPROLOL TARTRATE 1 MG/ML IV SOLN
5.0000 mg | Freq: Four times a day (QID) | INTRAVENOUS | Status: DC
  Administered 2013-02-20: 5 mg via INTRAVENOUS

## 2013-02-20 MED ORDER — METOPROLOL TARTRATE 1 MG/ML IV SOLN: 5.0000 mg | Freq: Four times a day (QID) | INTRAVENOUS | Status: DC

## 2013-02-20 MED ORDER — DEXTROSE 5 % IV BOLUS
250.0000 mL | Freq: Once | INTRAVENOUS | Status: AC
  Administered 2013-02-20: 250 mL via INTRAVENOUS

## 2013-02-20 MED ORDER — FUROSEMIDE 10 MG/ML IJ SOLN
INTRAMUSCULAR | Status: AC
  Filled 2013-02-20: qty 4

## 2013-02-20 MED ORDER — AMIODARONE HCL IN DEXTROSE 360-4.14 MG/200ML-% IV SOLN
60.0000 mg/h | INTRAVENOUS | Status: AC
  Administered 2013-02-20: 60 mg/h via INTRAVENOUS
  Filled 2013-02-20 (×2): qty 200

## 2013-02-20 MED ORDER — DEXTROSE 5 % IV BOLUS
500.0000 mL | Freq: Once | INTRAVENOUS | Status: AC
  Administered 2013-02-20: 500 mL via INTRAVENOUS

## 2013-02-20 MED ORDER — AMIODARONE HCL IN DEXTROSE 360-4.14 MG/200ML-% IV SOLN
30.0000 mg/h | INTRAVENOUS | Status: DC
  Administered 2013-02-21 – 2013-02-26 (×6): 30 mg/h via INTRAVENOUS
  Filled 2013-02-20 (×26): qty 200

## 2013-02-20 MED ORDER — AMIODARONE LOAD VIA INFUSION
150.0000 mg | Freq: Once | INTRAVENOUS | Status: AC
  Administered 2013-02-20: 150 mg via INTRAVENOUS
  Filled 2013-02-20: qty 83.34

## 2013-02-20 MED ORDER — FUROSEMIDE 10 MG/ML IJ SOLN
20.0000 mg | Freq: Once | INTRAMUSCULAR | Status: AC
  Administered 2013-02-20: 20 mg via INTRAVENOUS

## 2013-02-20 MED ORDER — SODIUM BICARBONATE 8.4 % IV SOLN
INTRAVENOUS | Status: DC
  Administered 2013-02-20 (×2): via INTRAVENOUS
  Filled 2013-02-20 (×3): qty 150

## 2013-02-20 NOTE — Consult Note (Signed)
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 He was admitted for treatment of aspiration pneumonia and acute on chronic renal failure.  The course was complicated by AF with RVR and hemodynamic instability.  PCCM was asked to evaluate for respiratory failure and shock.  SIGNIFICANT EVENTS / STUDIES:  12/23  Renal US >>> Medical renal disease. No hydronephrosis.  Simple cysts in both kidney.  LINES / TUBES: Foley 12/22 >>>  CULTURES: 12/22 Blood >>> 12/22 Urine >>> neg 12/23 MRSA PCR >>> POS 12/26 Blood >>>  ANTIBIOTICS: Zosyn 12/26 >>>  The patient is encephalopathic and unable to provide history, which was obtained for available medical records.  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 He was admitted for treatment of aspiration pneumonia and acute on chronic renal failure.  The course was complicated by AF with RVR and hemodynamic instability.  PCCM was asked to evaluate for respiratory failure and shock.  PAST MEDICAL HISTORY :  Past Medical History  Diagnosis Date  . Hyperlipidemia   . Diabetes mellitus without complication   . Anemia   . Hypertension   . Stroke   . Arthritis   . Dysphagia   . Vitamin D deficiency   . Depressive disorder, not elsewhere classified   . Unspecified hereditary and idiopathic peripheral neuropathy   . 9 completed weeks of gestation   . Malignant hypertensive heart/renal dis   . COPD (chronic obstructive pulmonary disease)    History reviewed. No pertinent past surgical  history. Prior to Admission medications   Medication Sig Start Date End Date Taking? Authorizing Provider  atorvastatin (LIPITOR) 20 MG tablet Take 20 mg by mouth daily.   Yes Historical Provider, MD  carvedilol (COREG) 6.25 MG tablet Take 6.25 mg by mouth 2 (two) times daily with a meal.   Yes Historical Provider, MD  clopidogrel (PLAVIX) 75 MG tablet Take 75 mg by mouth daily.   Yes Historical Provider, MD  gabapentin (NEURONTIN) 400 MG capsule Take 400 mg by mouth daily.   Yes Historical Provider, MD  insulin aspart (NOVOLOG) 100 UNIT/ML injection Inject 5 Units into the skin 3 (three) times daily before meals. 5 units prior to meals for cbg >= 150   Yes Historical Provider, MD  insulin glargine (LANTUS) 100 UNIT/ML injection Inject 20 Units into the skin at bedtime.   Yes Historical Provider, MD  ipratropium-albuterol (DUONEB) 0.5-2.5 (3) MG/3ML SOLN Take 3 mLs by nebulization every 6 (six) hours as needed (for breathing).   Yes Historical Provider, MD  oseltamivir (TAMIFLU) 75 MG capsule Take 75 mg by mouth daily. For 14 days (started 02/12/13)   Yes Historical Provider, MD   No Known Allergies  FAMILY HISTORY:  History reviewed. No pertinent family history.  SOCIAL HISTORY:  reports that he has never smoked. He does not have any smokeless tobacco history on file. His alcohol and drug histories are not on file.  REVIEW OF SYSTEMS:  Unable to provide.  INTERVAL HISTORY:  VITAL SIGNS: Temp:  [96.3 F (35.7  C)-100 F (37.8 C)] 98 F (36.7 C) (12/27 1500) Pulse Rate:  [28-152] 108 (12/27 1800) Resp:  [16-40] 31 (12/27 1800) BP: (67-127)/(33-95) 104/60 mmHg (12/27 1800) SpO2:  [66 %-100 %] 100 % (12/27 1800)  HEMODYNAMICS:   VENTILATOR SETTINGS:   INTAKE / OUTPUT: Intake/Output     12/26 0701 - 12/27 0700 12/27 0701 - 12/28 0700   P.O. 50    I.V. (mL/kg) 2188.5 (33.2) 738.9 (11.2)   IV Piggyback 100 50   Total Intake(mL/kg) 2338.5 (35.5) 788.9 (12)   Urine (mL/kg/hr) 650  (0.4) 175 (0.2)   Total Output 650 175   Net +1688.5 +613.9          PHYSICAL EXAMINATION: General:  Appears acutely ill, work of breathing increased  Neuro:  Encephalopathic, weak cough HEENT:  PERRL, dry membranes Cardiovascular:  Tachycardic, irregular Lungs:  Bilateral coarse rhonchi Abdomen:  Obese, soft Musculoskeletal:  Diffuse edema Skin:  Intact  LABS: CBC  Recent Labs Lab 02/17/13 0543 02/18/13 0915 02/19/13 1928  WBC 7.7 13.2* 16.7*  HGB 10.6* 11.5* 10.4*  HCT 33.3* 36.4* 32.7*  PLT 298 282 289   Coag's No results found for this basename: APTT, INR,  in the last 168 hours BMET  Recent Labs Lab 02/19/13 0407 02/19/13 1928 02/20/13 0435  NA 155* 141 142  K 3.5 3.5 3.5  CL 123* 111 111  CO2 19 17* 16*  BUN 96* 84* 86*  CREATININE 4.81* 4.36* 4.66*  GLUCOSE 142* 351* 337*   Electrolytes  Recent Labs Lab 02/17/13 0543  02/19/13 0407 02/19/13 1928 02/20/13 0435  CALCIUM 7.6*  < > 7.4* 6.9* 6.7*  PHOS 4.3  --  3.4  --  3.8  < > = values in this interval not displayed. Sepsis Markers  Recent Labs Lab 02/20/13 1015  LATICACIDVEN 1.6   ABG  Recent Labs Lab 02/15/13 1742 02/19/13 1926  PHART 7.372 7.372  PCO2ART 33.4* 25.2*  PO2ART 47.0* 123.0*   Liver Enzymes  Recent Labs Lab 02/15/13 1908  02/19/13 0407 02/19/13 1928 02/20/13 0435  AST 31  --   --  16  --   ALT 24  --   --  14  --   ALKPHOS 76  --   --  70  --   BILITOT 0.4  --   --  0.3  --   ALBUMIN 2.0*  < > 1.6* 1.4* 1.3*  < > = values in this interval not displayed. Cardiac Enzymes  Recent Labs Lab 02/15/13 1908 02/19/13 2250 02/20/13 0143 02/20/13 0910  TROPONINI <0.30 0.38* 0.58* 0.70*  PROBNP 987.2*  --   --   --    Glucose  Recent Labs Lab 02/19/13 2241 02/20/13 0015 02/20/13 0414 02/20/13 0738 02/20/13 1132 02/20/13 1526  GLUCAP 265* 302* 290* 309* 231* 117*    CXR:  12/27  >>> Patchy bilateral airspace disease  ASSESSMENT / PLAN:  Acute  respiratory failure Acute on chronic renal failure Acute pulmonary edema Possible aspiration pneumonia AF-RVR Hypotension, likely secondary to Cardizem vs AF-RVR on the background of diastolic dysfunction vs SIRS / sepsis (less likely) Acute encephalopaty  -->  Discussed possible ICU interventions with family and medical POA: no aggressive interventions ( CPR, mechanical ventilation, cardioversion, invasive lines, HD ) are desired; full medical management for now; focus on comfort if deteriorates further -->  BiPAP is relatively contraindicated as dysphagia, aspiration and acute encephalopathy -->  Cardizem d/c'd -->  Amiodarone started -->  Agree with  Zosyn, may add Vancomycin as MRSA PCR positive -->  Check PCT -->  TTE -->  Aggressive diuresis -->  No indications to transfer to ICU. SDU placement is appropriate. PCCM will sign off. Please re consult if necessary.  I have personally obtained history, examined patient, evaluated and interpreted laboratory and imaging results, reviewed medical records, formulated assessment / plan and placed orders.  CRITICAL CARE:  The patient is critically ill with multiple organ systems failure and requires high complexity decision making for assessment and support, frequent evaluation and titration of therapies, application of advanced monitoring technologies and extensive interpretation of multiple databases. Critical Care Time devoted to patient care services described in this note is 35 minutes.   Lonia Farber, MD Pulmonary and Critical Care Medicine [facility] Pager: [phone]  02/20/2013, 6:12 PM

## 2013-02-20 NOTE — Progress Notes (Signed)
CRITICAL VALUE ALERT  Critical value received: Trop 0.38   Date of notification: 02/20/13   Time of notification: 0033   Critical value read back:yes  Nurse who received alert: Cindra Eves   MD notified (1st page):  M. Burnadette Peter NP  Time of first page:  0035  MD notified (2nd page):  Time of second page:  Responding MD:  M. Burnadette Peter NP  Time MD responded: [phone]

## 2013-02-20 NOTE — Progress Notes (Signed)
Pt BP 70's/40's M. Lynch NP notified and gave orders will continue to monitor.

## 2013-02-20 NOTE — Progress Notes (Addendum)
[facility] ROUNDING NOTE   Subjective:   Interval History: hemiplegic  And aphasic now hypotensive  Objective:  Vital signs in last 24 hours:  Temp:  [96.3 F (35.7 C)-100 F (37.8 C)] 97.5 F (36.4 C) (12/27 0741) Pulse Rate:  [28-144] 97 (12/27 0741) Resp:  [19-40] 21 (12/27 0741) BP: (67-180)/(33-80) 100/51 mmHg (12/27 0741) SpO2:  [66 %-100 %] 100 % (12/27 0741)  Weight change:  Filed Weights   02/17/13 0348 02/18/13 0510 02/19/13 0440  Weight: 66.9 kg (147 lb 7.8 oz) 66.679 kg (147 lb) 65.953 kg (145 lb 6.4 oz)    Intake/Output: I/O last 3 completed shifts: In: 3878.1 [P.O.:50; I.V.:3678.1; IV Piggyback:150] Out: 700 [Urine:700]   Intake/Output this shift:  Total I/O In: 62.5 [I.V.:62.5] Out: 50 [Urine:50]  CVS- RRR RS- CTA ABD- BS present soft non-distended EXT- no edema   Basic Metabolic Panel:  Recent Labs Lab 02/16/13 0450 02/17/13 0543 02/17/13 2020 02/18/13 0915 02/19/13 0407 02/19/13 1928 02/20/13 0435  NA 154* 163* 158* 160* 155* 141 142  K 4.5 3.7 3.2* 3.3* 3.5 3.5 3.5  CL 118* 128* 125* 125* 123* 111 111  CO2 20 18* 19 21 19  17* 16*  GLUCOSE 244* 163* 143* 180* 142* 351* 337*  BUN 133* 122* 111* 108* 96* 84* 86*  CREATININE 6.62* 6.20* 5.68* 5.45* 4.81* 4.36* 4.66*  CALCIUM 7.7* 7.6* 7.5* 7.4* 7.4* 6.9* 6.7*  PHOS  --  4.3  --   --  3.4  --  3.8    Liver Function Tests:  Recent Labs Lab 02/15/13 1908 02/17/13 0543 02/19/13 0407 02/19/13 1928 02/20/13 0435  AST 31  --   --  16  --   ALT 24  --   --  14  --   ALKPHOS 76  --   --  70  --   BILITOT 0.4  --   --  0.3  --   PROT 6.0  --   --  5.0*  --   ALBUMIN 2.0* 1.8* 1.6* 1.4* 1.3*   No results found for this basename: LIPASE, AMYLASE,  in the last 168 hours No results found for this basename: AMMONIA,  in the last 168 hours  CBC:  Recent Labs Lab 02/15/13 1908 02/16/13 0450 02/17/13 0543 02/18/13 0915 02/19/13 1928  WBC 13.5* 9.8 7.7 13.2* 16.7*   NEUTROABS 10.1*  --   --   --   --   HGB 11.9* 11.4* 10.6* 11.5* 10.4*  HCT 38.0* 36.2* 33.3* 36.4* 32.7*  MCV 97.4 96.3 94.9 96.0 95.9  PLT 235 236 298 282 289    Cardiac Enzymes:  Recent Labs Lab 02/15/13 1908 02/19/13 2250 02/20/13 0143 02/20/13 0910  TROPONINI <0.30 0.38* 0.58* 0.70*    BNP: No components found with this basename: POCBNP,   CBG:  Recent Labs Lab 02/19/13 1600 02/19/13 2241 02/20/13 0015 02/20/13 0414 02/20/13 0738  GLUCAP 179* 265* 302* 290* 309*    Microbiology: Results for orders placed during the hospital encounter of 02/15/13  URINE CULTURE     Status: None   Collection Time    02/15/13  8:30 PM      Result Value Range Status   Specimen Description URINE, CATHETERIZED   Final   Special Requests NONE   Final   Culture  Setup Time     Final   Value: 02/16/2013 01:51     Performed at [facility] Count     Final  Value: NO GROWTH     Performed at [facility]   Culture     Final   Value: NO GROWTH     Performed at [facility]   Report Status 02/16/2013 FINAL   Final  CULTURE, BLOOD (ROUTINE X 2)     Status: None   Collection Time    02/15/13  8:55 PM      Result Value Range Status   Specimen Description BLOOD ARM RIGHT   Final   Special Requests BOTTLES DRAWN AEROBIC AND ANAEROBIC 10CC   Final   Culture  Setup Time     Final   Value: 02/16/2013 04:12     Performed at [facility]   Culture     Final   Value:        BLOOD CULTURE RECEIVED NO GROWTH TO DATE CULTURE WILL BE HELD FOR 5 DAYS BEFORE ISSUING A FINAL NEGATIVE REPORT     Performed at [facility]   Report Status PENDING   Incomplete  CULTURE, BLOOD (ROUTINE X 2)     Status: None   Collection Time    02/15/13  9:05 PM      Result Value Range Status   Specimen Description BLOOD ARM LEFT   Final   Special Requests BOTTLES DRAWN AEROBIC ONLY 10CC   Final   Culture  Setup Time     Final   Value: 02/16/2013 04:12      Performed at [facility]   Culture     Final   Value:        BLOOD CULTURE RECEIVED NO GROWTH TO DATE CULTURE WILL BE HELD FOR 5 DAYS BEFORE ISSUING A FINAL NEGATIVE REPORT     Performed at [facility]   Report Status PENDING   Incomplete  MRSA PCR SCREENING     Status: Abnormal   Collection Time    02/16/13  1:08 AM      Result Value Range Status   MRSA by PCR POSITIVE (*) NEGATIVE Final   Comment:            The GeneXpert MRSA Assay (FDA     approved for NASAL specimens     only), is one component of a     comprehensive MRSA colonization     surveillance program. It is not     intended to diagnose MRSA     infection nor to guide or     monitor treatment for     MRSA infections.     RESULT CALLED TO, READ BACK BY AND VERIFIED WITH:     HARAWAY,J,RN 0230 02/16/13 MITCHELL,L    Coagulation Studies: No results found for this basename: LABPROT, INR,  in the last 72 hours  Urinalysis: No results found for this basename: COLORURINE, APPERANCEUR, LABSPEC, PHURINE, GLUCOSEU, HGBUR, BILIRUBINUR, KETONESUR, PROTEINUR, UROBILINOGEN, NITRITE, LEUKOCYTESUR,  in the last 72 hours    Imaging: Dg Chest Port 1 View  02/20/2013   CLINICAL DATA:  Aspiration pneumonia  EXAM: PORTABLE CHEST - 1 VIEW  COMPARISON:  February 19, 2013  FINDINGS: There is diffuse interstitial edema with slightly greater opacity in the bases compared to elsewhere. Heart is upper normal in size. The pulmonary vascularity suggests mild pulmonary venous hypertension. No pneumothorax. No adenopathy.  IMPRESSION: The overall appearance suggests a degree of congestive heart failure. Patchy opacity in both lower lobes could represent underlying pneumonia and/or aspiration. There is slightly more opacity in the left base  compared to 1 day prior which could represent either edema or possibly aspiration in the appropriate clinical setting.   Electronically Signed   By: Bretta Bang M.D.   On: 02/20/2013 08:45    Dg Chest Port 1 View  02/19/2013   CLINICAL DATA:  Rapid response. Respiratory distress. Evaluate for aspiration.  EXAM: PORTABLE CHEST - 1 VIEW  COMPARISON:  02/18/2013  FINDINGS: Heart size is normal. Small left pleural effusion is present. Lung volumes are low. There is chronic interstitial coarsening identified bilaterally suggestive of interstitial lung disease. There is an opacity within the left base which may represent pneumonia or aspiration.  IMPRESSION: 1. Left base opacity which may represent aspiration or pneumonia. 2. Chronic interstitial lung disease. 3. Left effusion.   Electronically Signed   By: Signa Kell M.D.   On: 02/19/2013 19:10     Medications:   . diltiazem (CARDIZEM) infusion Stopped (02/20/13 0510)  .  sodium bicarbonate  infusion 1000 mL 125 mL/hr at 02/20/13 0930   . antiseptic oral rinse  15 mL Mouth Rinse BID  . aspirin  300 mg Rectal Daily  . heparin  5,000 Units Subcutaneous Q8H  . insulin aspart  0-9 Units Subcutaneous Q4H  . insulin glargine  15 Units Subcutaneous QHS  . mupirocin ointment  1 application Nasal BID  . piperacillin-tazobactam (ZOSYN)  IV  2.25 g Intravenous Q8H  . sodium chloride  3 mL Intravenous Q12H   acetaminophen, hydrALAZINE, levalbuterol, metoprolol, ondansetron (ZOFRAN) IV, ondansetron  Assessment/ Plan:  72 year old white male with severe CVA limiting oral intake who presents with what appears to be volume completion and acute on chronic renal failure with hypernatremia.  1.Renal- His acute on chronic renal failure presentation seems to be due to volume depletion. It has improved slightly with hydration and he actually has a reasonable urine output. His renal ultrasound is consistent with chronic kidney disease. He has a baseline creatinine of 1.7 although I'm not sure when that was. It was recent, then I would suspect he has a reasonable expectation to continue to improve his renal function with gentle hydration. If, however  it was quite some time ago like a year, then we may not be as fortunate. He has no acute indications for dialysis at this time. I agree he would be a marginal candidate for it.  2. Hypertension/volume - sodium improved with water.  3. Pulmonary- it is entirely possible that he could be aspirating given his issues. He is on empiric antibiotics. I appreciate the input from palliative care. Patient is a DO NOT INTUBATE /DO NOT RESUSCITATE.  4. Anemia - Is stable  5. Hypokalemia - improved 6. Metabolic Acidosis  Agree with IV bicarbonate   At 4.30 pm received call from nurse that condition now appears to be worsening. Specifically hypotension and increased respiratory distress with CXR demonstrating increased pulmonary congestion. The urine output is poor and positive fluid balance of 3 -4 Liters. He is also in atrial fibrillation. After discussing with Dr Waymon Amato, a call was made to [facility] to clarify his understanding of the worsening condition and the limitations imposed by the DNR order. He stated that he has been kept current with the situation and wishes to allow intubation of his father should the need arise. This was communicated to Dr Waymon Amato and the nurse. In addition, I spoke with Dr Ladona Ridgel, with palliative care, who is very familiar with Mr Ladona Ridgel and family. She intends to contact the POA again  to plan the optimal care for the patient.   LOS: 5 Eric Graves W @TODAY @11 :29 AM

## 2013-02-20 NOTE — Progress Notes (Signed)
Dr Hyman Hopes spoke with son regarding father's health status declining. Son Casimiro Needle wants everything done at this time. RN also talked with son to make sure was clear with code status and stated that he wants everything done for his father a this time even if it means to intubate.

## 2013-02-20 NOTE — Progress Notes (Signed)
Addendum:  Patient seemed to be doing better this morning than he was last night. However since this morning, he has had a steady decline: Reduced urine output, A. fib with ventricular rate continuing to rise in the 110s-130s & tenuous respiratory status. Dr. Conseco, nephrology discussed with patient's son/health care power of attorney [name] who apparently wished to change patient's CODE STATUS to full code. Dr. Web called and requested that I speak with patient's son to clarify same. Discussed at length via phone with patient's son [name], updated patient's care and progressive decline in status. He verbalized understanding and seemed torn between what he wanted for patient and what other family members want. Eventually he categorically stated that he wanted patient to be full code and wanted "everything done" at least for "a couple of days". Dr. Web had changed CODE STATUS to full code. Consulted critical care M.D. for evaluation and transfer to intensive care unit. Dr.Zubelevitskiy evaluated patient, discussed with healthcare power of attorney and extended family and called back to state that they were okay with DO NOT RESUSCITATE status but wished to continue aggressive medical treatment for treatable conditions but if patient declines, they were open to comfort oriented care.  Eric Scott, MD, FACP, FHM. [facility] Pager [phone]  If 7PM-7AM, please contact night-coverage [email or web address] Password [facility] 02/20/2013, 7:19 PM

## 2013-02-20 NOTE — Progress Notes (Signed)
Patient ZO:XWRUEA Brancato      DOB: [birth year]      VWU:[id number]   Palliative Medicine Team at [facility] Progress Note    Subjective: Events of last evening noted spoke with Dr. Bennie Pierini times two earlier today.  Spoke with Casimiro Needle this am and was frank with him about the potential decline and death of his dad.  He stated he would be up in the evening and so I was to return to speak with him.  In the interim.  Received call from Dr. Conseco regarding Michael's ambivalence related to code status despite clear understanding during our visit initially.  Noted transient change to code status and conversation with Dr.  Herma Carson.  Returned to check on family this evening.  The patient is relatively comfortable.  Has copious secretions with deep cough. Family confirmed they had discused things between [employer] and that they had changed their minds back to DNR but treat the treatable with attention to comfort when needed.   Filed Vitals:   02/20/13 2000  BP: 92/60  Pulse: 78  Temp: 97.4 F (36.3 C)  Resp: 27   Physical exam:  General: wet cough no distress unless coughing Perrl, eomi, anicteric Chest decreased with wet rhonchi cvs irregular rate and rhythm s1,s2 adbs soft Extremities contracture on the left,  Neuro non verbal       Assessment and plan:72 yr old with history of cva with residual aphasi, hemiparesis admitted with altered mental status related to hyper natremia, renal failure and dehydration possible infection. Course complicated by aspirtion with respiratlory failure and afib with rvr. Family sruggling with decisions for comfort vs cure  1. dnr stands. Confirmed with Poa 2. Respiratory failure . Patient had been on bipap and family originally agree to use bipap for rescue  Will work with family again in am    Total time 730 pm-800 pm

## 2013-02-20 NOTE — Progress Notes (Signed)
CSW continues to follow pt for disposition/return to NH.

## 2013-02-20 NOTE — Progress Notes (Signed)
Pt Bp continues to be 70's/40's M. Lynch NP notified and gave orders. Will continued to monitor.

## 2013-02-20 NOTE — Progress Notes (Signed)
[facility] PROGRESS NOTE    Eric Graves ZOX:[id number] DOB: May 20, 1940 DOA: 02/15/2013 PCP: Bufford Spikes, DO  HPI/Brief narrative 72 y.o. male with hx of HTN, CVA with left side hemiplegia, dysphagia requiring nectar thickened food, HTN, hx of COPD, brought to the ER as he was having increase shortness of breath. He does not communicate and denied he was having shortness of breath. Evalaution in the ER showed CXR with mild vascular congestion, AKI with Cr of 6.64 and BUN of 132. His baseline Cr was 1.7. He has a WBC of 13K, a Na of 155, and normal LFTs. Hospitalist was asked to admit him for AKI. He was started on VAN/Zosyn for ? PNA.  Assessment/Plan:  Aspiration pneumonia with sepsis/Dysphagia - On 12/26, following initiation of dysphagia diet, patient seems to have aspirated and went into acute respiratory failure and rapid A. Fib. - Changed to n.p.o. Status, broaden the antibiotic spectrum Zosyn and vancomycin, pulmonary toilet - Clinically better today. Follow chest x-ray.  Acute respiratory failure - Apparently present on admission: He was placed on BiPAP on route to ED. - Most likely secondary to aspiration pneumonia and less likely due to pulmonary edema - Respiratory status had improved but declined again on 12/26. Please see above  A. Fib with RVR - Started 12/26 PM. Likely precipitated by acute respiratory events. - He did not respond to IV metoprolol. Transferred to the step down unit and placed on Cardizem drip. Over the next 12 hours, his heart rate is controlled in the 70s-80s and he is off Cardizem drip on 12/27 - Followup 2-D echo and continue monitoring on telemetry. When necessary IV metoprolol. - Mildly elevated troponin likely secondary to stress of RVR and aspiration pneumonia. Followup 2-D echo. He will not be a candidate for aggressive ischemic evaluation.  Hypotension - Likely secondary to A. Fib with RVR and sepsis from aspiration pneumonia - Blood  pressures have improved and are SBP in the 90-100 this morning. - Continue IV fluids.  Anion gap metabolic acidosis - Likely secondary to renal failure. Check lactate and serum acetones. Less likely due to DKA. - Start bicarbonate drip.  Acute on chronic renal failure-Principal admitting diagnosis - Likely secondary to volume depletion - Baseline creatinine in H&P was recorded as 1.7 but unsure as to when and where it was obtained from. - Nephrology consultation & followup appreciated. - Creatinine continues to improve. Trend BMP  Hypernatremic dehydration - Hypernatremia resolved. Change IV fluids to bicarbonate drip.  Anemia - Possibly chronic.  stable   Dysphagia - Aspirated on dysphagia diet. N.p.o. Discussed at length with 2 sons on 12/26 PM. Henceforth, he probably will not be a good candidate for oral intake given high risk for recurrent aspiration. Advised them that PEG tube will also carry some risk of aspiration. Palliative care team to followup with patient and family.  Type II DM - Uncontrolled. However due to n.p.o. status, change SSI to every 4 hours.Continue Lantus  Failure to thrive - Palliative care team input appreciated.  Hypertension: -Currently soft blood pressures. Carvedilol on hold  Code Status: DO NOT RESUSCITATE  Family Communication: Discussed with son Casimiro Needle via phone 12/26 and his brother and sister-in-law at bedside on 12/26 Disposition Plan: To be determined    Consultants:  Palliative care team    nephrology  Procedures:  None   Antibiotics:  IV Rocephin DC'd  IV vancomycin 12/26 >  IV Zosyn 12/26 >  Subjective: Aphasic/nonverbal.  Objective: Filed Vitals:   02/20/13  0600 02/20/13 0634 02/20/13 0700 02/20/13 0741  BP: 84/38 83/46 77/47  100/51  Pulse: 59 70  97  Temp:    97.5 F (36.4 C)  TempSrc:    Rectal  Resp: 21 40 19 21  Height:      Weight:      SpO2: 100% 100% 100% 100%    Intake/Output Summary (Last 24  hours) at 02/20/13 0809 Last data filed at 02/20/13 0742  Gross per 24 hour  Intake 2288.5 ml  Output    700 ml  Net 1588.5 ml   Filed Weights   02/17/13 0348 02/18/13 0510 02/19/13 0440  Weight: 66.9 kg (147 lb 7.8 oz) 66.679 kg (147 lb) 65.953 kg (145 lb 6.4 oz)     Exam:  General exam: Chronically ill looking elderly male lying propped up in bed in minimal respiratory distress-Almost at baseline.He looks much better than last night and close to what he was prior to his decline on 12/26 PM Respiratory system:   Clear to auscultation anteriorly.Harsh breath sounds posteriorly with scattered bilateral crackles. No wheezing or rhonchi. Minimal increased work of breathing. Cardiovascular system: S1 & S2 heard, Irregularly irregular. No JVD, murmurs, gallops, clicks or pedal edema. telemetry: A. Fib with ventricular rate in the 70s-80s. Gastrointestinal system: Abdomen is nondistended, soft and nontender. Normal bowel sounds heard. Central nervous system: Alert and obeys some commands. Nonverbal. No new focal neurological deficits. Extremities: Symmetric 4x 5 power.   Data Reviewed: Basic Metabolic Panel:  Recent Labs Lab 02/16/13 0450 02/17/13 0543 02/17/13 2020 02/18/13 0915 02/19/13 0407 02/19/13 1928 02/20/13 0435  NA 154* 163* 158* 160* 155* 141 142  K 4.5 3.7 3.2* 3.3* 3.5 3.5 3.5  CL 118* 128* 125* 125* 123* 111 111  CO2 20 18* 19 21 19  17* 16*  GLUCOSE 244* 163* 143* 180* 142* 351* 337*  BUN 133* 122* 111* 108* 96* 84* 86*  CREATININE 6.62* 6.20* 5.68* 5.45* 4.81* 4.36* 4.66*  CALCIUM 7.7* 7.6* 7.5* 7.4* 7.4* 6.9* 6.7*  PHOS  --  4.3  --   --  3.4  --  3.8   Liver Function Tests:  Recent Labs Lab 02/15/13 1908 02/17/13 0543 02/19/13 0407 02/19/13 1928 02/20/13 0435  AST 31  --   --  16  --   ALT 24  --   --  14  --   ALKPHOS 76  --   --  70  --   BILITOT 0.4  --   --  0.3  --   PROT 6.0  --   --  5.0*  --   ALBUMIN 2.0* 1.8* 1.6* 1.4* 1.3*   No  results found for this basename: LIPASE, AMYLASE,  in the last 168 hours No results found for this basename: AMMONIA,  in the last 168 hours CBC:  Recent Labs Lab 02/15/13 1908 02/16/13 0450 02/17/13 0543 02/18/13 0915 02/19/13 1928  WBC 13.5* 9.8 7.7 13.2* 16.7*  NEUTROABS 10.1*  --   --   --   --   HGB 11.9* 11.4* 10.6* 11.5* 10.4*  HCT 38.0* 36.2* 33.3* 36.4* 32.7*  MCV 97.4 96.3 94.9 96.0 95.9  PLT 235 236 298 282 289   Cardiac Enzymes:  Recent Labs Lab 02/15/13 1908 02/19/13 2250 02/20/13 0143  TROPONINI <0.30 0.38* 0.58*   BNP (last 3 results)  Recent Labs  02/15/13 1908  PROBNP 987.2*   CBG:  Recent Labs Lab 02/19/13 1222 02/19/13 1600 02/19/13 2241 02/20/13 0015 02/20/13 0414  GLUCAP 213* 179* 265* 302* 290*    Recent Results (from the past 240 hour(s))  URINE CULTURE     Status: None   Collection Time    02/15/13  8:30 PM      Result Value Range Status   Specimen Description URINE, CATHETERIZED   Final   Special Requests NONE   Final   Culture  Setup Time     Final   Value: 02/16/2013 01:51     Performed at [facility] Count     Final   Value: NO GROWTH     Performed at [facility]   Culture     Final   Value: NO GROWTH     Performed at [facility]   Report Status 02/16/2013 FINAL   Final  CULTURE, BLOOD (ROUTINE X 2)     Status: None   Collection Time    02/15/13  8:55 PM      Result Value Range Status   Specimen Description BLOOD ARM RIGHT   Final   Special Requests BOTTLES DRAWN AEROBIC AND ANAEROBIC 10CC   Final   Culture  Setup Time     Final   Value: 02/16/2013 04:12     Performed at [facility]   Culture     Final   Value:        BLOOD CULTURE RECEIVED NO GROWTH TO DATE CULTURE WILL BE HELD FOR 5 DAYS BEFORE ISSUING A FINAL NEGATIVE REPORT     Performed at [facility]   Report Status PENDING   Incomplete  CULTURE, BLOOD (ROUTINE X 2)     Status: None   Collection Time     02/15/13  9:05 PM      Result Value Range Status   Specimen Description BLOOD ARM LEFT   Final   Special Requests BOTTLES DRAWN AEROBIC ONLY 10CC   Final   Culture  Setup Time     Final   Value: 02/16/2013 04:12     Performed at [facility]   Culture     Final   Value:        BLOOD CULTURE RECEIVED NO GROWTH TO DATE CULTURE WILL BE HELD FOR 5 DAYS BEFORE ISSUING A FINAL NEGATIVE REPORT     Performed at [facility]   Report Status PENDING   Incomplete  MRSA PCR SCREENING     Status: Abnormal   Collection Time    02/16/13  1:08 AM      Result Value Range Status   MRSA by PCR POSITIVE (*) NEGATIVE Final   Comment:            The GeneXpert MRSA Assay (FDA     approved for NASAL specimens     only), is one component of a     comprehensive MRSA colonization     surveillance program. It is not     intended to diagnose MRSA     infection nor to guide or     monitor treatment for     MRSA infections.     RESULT CALLED TO, READ BACK BY AND VERIFIED WITH:     HARAWAY,J,RN 0230 02/16/13 MITCHELL,L         Studies: Dg Chest Port 1 View  02/19/2013   CLINICAL DATA:  Rapid response. Respiratory distress. Evaluate for aspiration.  EXAM: PORTABLE CHEST - 1 VIEW  COMPARISON:  02/18/2013  FINDINGS: Heart size is normal. Small  left pleural effusion is present. Lung volumes are low. There is chronic interstitial coarsening identified bilaterally suggestive of interstitial lung disease. There is an opacity within the left base which may represent pneumonia or aspiration.  IMPRESSION: 1. Left base opacity which may represent aspiration or pneumonia. 2. Chronic interstitial lung disease. 3. Left effusion.   Electronically Signed   By: Signa Kell M.D.   On: 02/19/2013 19:10        Scheduled Meds: . antiseptic oral rinse  15 mL Mouth Rinse BID  . aspirin  300 mg Rectal Daily  . heparin  5,000 Units Subcutaneous Q8H  . insulin aspart  0-9 Units Subcutaneous Q4H  . insulin  glargine  15 Units Subcutaneous QHS  . mupirocin ointment  1 application Nasal BID  . piperacillin-tazobactam (ZOSYN)  IV  2.25 g Intravenous Q8H  . sodium chloride  3 mL Intravenous Q12H   Continuous Infusions: . diltiazem (CARDIZEM) infusion Stopped (02/20/13 0510)  .  sodium bicarbonate  infusion 1000 mL      Principal Problem:   Renal failure, acute on chronic Active Problems:   Type II or unspecified type diabetes mellitus with neurological manifestations, not stated as uncontrolled(250.60)   Essential hypertension, benign   Other dysphagia   CVA (cerebral vascular accident)   COPD (chronic obstructive pulmonary disease)   AKI (acute kidney injury)   CHF (congestive heart failure)   DNR (do not resuscitate)   Dehydration with hypernatremia   Dysphagia, unspecified(787.20)    Time spent: 45 minutes    Elian Gloster, MD, FACP, FHM. [facility] Pager [phone]  If 7PM-7AM, please contact night-coverage [email or web address] Password [email or web address] 02/20/2013, 8:09 AM    LOS: 5 days

## 2013-02-21 DIAGNOSIS — I059 Rheumatic mitral valve disease, unspecified: Secondary | ICD-10-CM

## 2013-02-21 LAB — RENAL FUNCTION PANEL
BUN: 83 mg/dL — ABNORMAL HIGH (ref 6–23)
CO2: 26 mEq/L (ref 19–32)
GFR calc Af Amer: 13 mL/min — ABNORMAL LOW (ref >90)
Glucose, Bld: 180 mg/dL — ABNORMAL HIGH (ref 70–99)
Potassium: 3.1 mEq/L — ABNORMAL LOW (ref 3.5–5.1)
Sodium: 142 mEq/L (ref 135–145)

## 2013-02-21 LAB — VANCOMYCIN, RANDOM: Vancomycin Rm: 19.8 ug/mL

## 2013-02-21 LAB — GLUCOSE, CAPILLARY
Glucose-Capillary: 102 mg/dL — ABNORMAL HIGH (ref 70–99)
Glucose-Capillary: 141 mg/dL — ABNORMAL HIGH (ref 70–99)
Glucose-Capillary: 155 mg/dL — ABNORMAL HIGH (ref 70–99)
Glucose-Capillary: 171 mg/dL — ABNORMAL HIGH (ref 70–99)

## 2013-02-21 LAB — CBC
HCT: 32.8 % — ABNORMAL LOW (ref 39.0–52.0)
Hemoglobin: 10.6 g/dL — ABNORMAL LOW (ref 13.0–17.0)
RBC: 3.56 MIL/uL — ABNORMAL LOW (ref 4.22–5.81)

## 2013-02-21 MED ORDER — VANCOMYCIN HCL IN DEXTROSE 750-5 MG/150ML-% IV SOLN
750.0000 mg | Freq: Once | INTRAVENOUS | Status: AC
  Administered 2013-02-21: 750 mg via INTRAVENOUS
  Filled 2013-02-21: qty 150

## 2013-02-21 MED ORDER — POTASSIUM CHLORIDE 10 MEQ/100ML IV SOLN
10.0000 meq | INTRAVENOUS | Status: AC
Start: 2013-02-21 — End: 2013-02-21
  Administered 2013-02-21 (×3): 10 meq via INTRAVENOUS
  Filled 2013-02-21 (×3): qty 100

## 2013-02-21 MED ORDER — POTASSIUM CHLORIDE 10 MEQ/100ML IV SOLN
10.0000 meq | INTRAVENOUS | Status: AC
  Administered 2013-02-21 (×3): 10 meq via INTRAVENOUS
  Filled 2013-02-21 (×2): qty 100

## 2013-02-21 MED ORDER — POTASSIUM CL IN DEXTROSE 5% 20 MEQ/L IV SOLN
20.0000 meq | INTRAVENOUS | Status: DC
  Administered 2013-02-21: 20 meq via INTRAVENOUS
  Filled 2013-02-21 (×3): qty 1000

## 2013-02-21 NOTE — Progress Notes (Signed)
ANTIBIOTIC CONSULT NOTE - Follow up  Pharmacy Consult for Vancomycin and Zosyn Indication: pneumonia  No Known Allergies  Patient Measurements: Height: 5\' 5"  (165.1 cm) Weight: 145 lb 6.4 oz (65.953 kg) (bed scale) IBW/kg (Calculated) : 61.5 Adjusted Body Weight:   Vital Signs: Temp: 98.1 F (36.7 C) (12/28 2038) Temp src: Axillary (12/28 2038) BP: 125/65 mmHg (12/28 2038) Pulse Rate: 89 (12/28 2038) Intake/Output from previous day: 12/27 0701 - 12/28 0700 In: 1893.6 [I.V.:1781.6; IV Piggyback:112] Out: 675 [Urine:675] Intake/Output from this shift: Total I/O In: 153.4 [I.V.:153.4] Out: -   Labs:  Recent Labs  02/19/13 1928 02/20/13 0435 02/21/13 0535  WBC 16.7*  --  14.3*  HGB 10.4*  --  10.6*  PLT 289  --  301  CREATININE 4.36* 4.66* 4.63*   Estimated Creatinine Clearance: 12.4 ml/min (by C-G formula based on Cr of 4.63).  Recent Labs  02/21/13 2058  VANCORANDOM 19.8     Assessment: 72yo male with CXR(+) aspiration pneumonia vs HCAP, on Rocephin now to change to Vancomycin and Zosyn.  Pt with acute on chronic kidney failure and improving Cr but seems to be stabilizing. 48 hr Vanc random was 19.8. Renal fx trending down but seems to have stabilized.  12/22 Urine (-) 12/22 Blood x 2: ngtd 12/26 Blood x 2:   Rocephin 12/23 >> 12/26 Vanco 12/26 >> Zosyn 12/26 >>    Goal of Therapy:  Vancomycin trough level 15-20 mcg/ml  Plan:  1.  Vancomycin 750 mg IV x 1 2.  Random Vancomycin level in 48hr 3.  Zosyn 2.25gm IV q8 4.  Watch renal function closely. Draw earlier vanc level if renal fx improves dramatically. 5.  F/U blood cultures  Vinnie Level, PharmD.  Clinical Pharmacist Pager [phone]

## 2013-02-21 NOTE — Progress Notes (Signed)
Echo Lab  2D Echocardiogram completed.  Eric Graves, RDCS 02/21/2013 10:50 AM

## 2013-02-21 NOTE — Progress Notes (Signed)
Patient ID: Eric Graves, male   DOB: [birth year], 72 y.o.   MRN: [id number]  [facility] Progress Note    Assessment/ Plan:   1. Acute renal failure chronic kidney disease stage III: This appears to be primarily hemodynamic from volume depletion. Renal function relatively stable overnight without any acute electrolyte of volume concerns. Continue to monitor closely on current gentle intravenous fluids. I explained to the sons that he would not be placed on dialysis even if he had complete renal shutdown due to poor overall prognosis. 2. Hypertension/volume - initially hypernatremic from water deficiency-improved with intravenous fluid/oral flushes.  3. Pneumonia-aspiration versus healthcare associated-on broad-spectrum antibiotic coverage with Zosyn . I appreciate the input from palliative care. Patient is a DO NOT INTUBATE /DO NOT RESUSCITATE.  4. Anemia - hemoglobin stable, no overt blood loss-no indication for ESA at this time (overall utility questioned)  5. Hypokalemia - replete via by mouth/IV route  6. Metabolic Acidosis: Improved with intravenous sodium bicarbonate-we'll reassess fluids is metabolic alkalosis may be suppressive to his respiratory drive.    Subjective:   The patient was transiently "full code" and now is back to a DO NOT RESUSCITATE status. Appreciate input from palliative care.    Objective:   BP 121/60  Pulse 88  Temp(Src) 97.9 F (36.6 C) (Oral)  Resp 27  Ht 5\' 5"  (1.651 m)  Wt 65.953 kg (145 lb 6.4 oz)  BMI 24.20 kg/m2  SpO2 100%  Intake/Output Summary (Last 24 hours) at 02/21/13 0945 Last data filed at 02/21/13 0734  Gross per 24 hour  Intake 1893.62 ml  Output    800 ml  Net 1093.62 ml   Weight change:   Physical Exam: Gen: Appears comfortable resting in bed-awakens to touch. Sons by bedside CVS: Pulse regular in rate and rhythm, S1 and S2 normal Resp: Coarse breath sounds bilaterally-on oxygen via Ventimask Abd: Soft, obese,  nontender bowel sounds, Ext: No lower extremity edema  Imaging: Dg Chest Port 1 View  02/20/2013   CLINICAL DATA:  Aspiration pneumonia  EXAM: PORTABLE CHEST - 1 VIEW  COMPARISON:  February 19, 2013  FINDINGS: There is diffuse interstitial edema with slightly greater opacity in the bases compared to elsewhere. Heart is upper normal in size. The pulmonary vascularity suggests mild pulmonary venous hypertension. No pneumothorax. No adenopathy.  IMPRESSION: The overall appearance suggests a degree of congestive heart failure. Patchy opacity in both lower lobes could represent underlying pneumonia and/or aspiration. There is slightly more opacity in the left base compared to 1 day prior which could represent either edema or possibly aspiration in the appropriate clinical setting.   Electronically Signed   By: Bretta Bang M.D.   On: 02/20/2013 08:45   Dg Chest Port 1 View  02/19/2013   CLINICAL DATA:  Rapid response. Respiratory distress. Evaluate for aspiration.  EXAM: PORTABLE CHEST - 1 VIEW  COMPARISON:  02/18/2013  FINDINGS: Heart size is normal. Small left pleural effusion is present. Lung volumes are low. There is chronic interstitial coarsening identified bilaterally suggestive of interstitial lung disease. There is an opacity within the left base which may represent pneumonia or aspiration.  IMPRESSION: 1. Left base opacity which may represent aspiration or pneumonia. 2. Chronic interstitial lung disease. 3. Left effusion.   Electronically Signed   By: Signa Kell M.D.   On: 02/19/2013 19:10    Labs: [employer]  Recent Labs Lab 02/16/13 0450 02/17/13 0543 02/17/13 2020 02/18/13 0915 02/19/13 0407 02/19/13 1928 02/20/13 0435 02/21/13  0535  NA 154* 163* 158* 160* 155* 141 142 142  K 4.5 3.7 3.2* 3.3* 3.5 3.5 3.5 3.1*  CL 118* 128* 125* 125* 123* 111 111 103  CO2 20 18* 19 21 19  17* 16* 26  GLUCOSE 244* 163* 143* 180* 142* 351* 337* 180*  BUN 133* 122* 111* 108* 96* 84* 86* 83*   CREATININE 6.62* 6.20* 5.68* 5.45* 4.81* 4.36* 4.66* 4.63*  CALCIUM 7.7* 7.6* 7.5* 7.4* 7.4* 6.9* 6.7* 6.5*  PHOS  --  4.3  --   --  3.4  --  3.8 4.1   CBC  Recent Labs Lab 02/15/13 1908  02/17/13 0543 02/18/13 0915 02/19/13 1928 02/21/13 0535  WBC 13.5*  < > 7.7 13.2* 16.7* 14.3*  NEUTROABS 10.1*  --   --   --   --   --   HGB 11.9*  < > 10.6* 11.5* 10.4* 10.6*  HCT 38.0*  < > 33.3* 36.4* 32.7* 32.8*  MCV 97.4  < > 94.9 96.0 95.9 92.1  PLT 235  < > 298 282 289 301  < > = values in this interval not displayed.  Medications:    . antiseptic oral rinse  15 mL Mouth Rinse BID  . aspirin  300 mg Rectal Daily  . heparin  5,000 Units Subcutaneous Q8H  . insulin aspart  0-9 Units Subcutaneous Q4H  . insulin glargine  15 Units Subcutaneous QHS  . mupirocin ointment  1 application Nasal BID  . piperacillin-tazobactam (ZOSYN)  IV  2.25 g Intravenous Q8H  . potassium chloride  10 mEq Intravenous Q1 Hr x 3  . sodium chloride  3 mL Intravenous Q12H   Zetta Bills, MD 02/21/2013, 9:45 AM

## 2013-02-21 NOTE — Progress Notes (Signed)
[facility] PROGRESS NOTE    Eric Graves WUJ:[id number] DOB: 08-17-1940 DOA: 02/15/2013 PCP: Bufford Spikes, DO  HPI/Brief narrative 72 y.o. male with hx of HTN, CVA with left side hemiplegia, dysphagia requiring nectar thickened food, HTN, hx of COPD, brought to the ER as he was having increase shortness of breath. He does not communicate and denied he was having shortness of breath. Evalaution in the ER showed CXR with mild vascular congestion, AKI with Cr of 6.64 and BUN of 132. His baseline Cr was 1.7. He has a WBC of 13K, a Na of 155, and normal LFTs. Hospitalist was asked to admit him for AKI. He was started on VAN/Zosyn for ? PNA.  Assessment/Plan:  Aspiration pneumonia with sepsis/Dysphagia - On 12/26, following initiation of dysphagia diet, patient seems to have aspirated and went into acute respiratory failure and rapid A. Fib. - Changed to n.p.o. Status, broadened the antibiotic spectrum Zosyn and vancomycin, pulmonary toilet - Respiratory status overall is tenuous but slightly improved compared to 2 nights ago. - If blood cultures continued to be negative, will DC vancomycin on 12/29 AM and continue vancomycin alone.  Acute respiratory failure - Apparently present on admission: He was placed on BiPAP on route to ED. - Most likely secondary to aspiration pneumonia and less likely due to pulmonary edema - Please see above - After going back and forth with family, patient currently is DO NOT RESUSCITATE/DO NOT INTUBATE  A. Fib with RVR - Started 12/26 PM. Likely precipitated by acute respiratory events. - He did not respond to IV metoprolol. Transferred to the step down unit and placed on Cardizem drip. He was weaned off Cardizem drip briefly but then went back into rapid A. fib on 12/27 AM. CCM started amiodarone. He has reverted to sinus rhythm on 12/28 at 2:40 a.m.  - Followup 2-D echo and continue monitoring on telemetry.  - Mildly elevated troponin likely secondary  to stress of RVR and aspiration pneumonia. Followup 2-D echo. He will not be a candidate for aggressive ischemic evaluation.  Hypotension - Likely secondary to A. Fib with RVR and sepsis from aspiration pneumonia - Improved. Continue gentle IV fluid hydration  Anion gap metabolic acidosis - Likely secondary to renal failure. Lactate and serum acetones normal. Less likely due to DKA. - Resolved after bicarbonate drip. DC bicarbonate  Acute on chronic renal failure-Principal admitting diagnosis - Likely secondary to volume depletion - Baseline creatinine in H&P was recorded as 1.7 but unsure as to when and where it was obtained from. - Nephrology consultation & followup appreciated. - Creatinine continues to improve. Trend BMP - Patient was oliguric all day yesterday:? Element of ATN. He did make 500 mL of urine last night. - Continue gentle IV fluids. Not candidate for hemodialysis.  Hypokalemia - Replete and follow BMP  Hypernatremic dehydration - Hypernatremia resolved. Improving.  Anemia - Possibly chronic.  stable   Dysphagia - Aspirated on dysphagia diet. N.p.o. Discussed at length with 2 sons on 12/26 PM. Henceforth, he probably will not be a good candidate for oral intake given high risk for recurrent aspiration. Advised them that PEG tube will also carry some risk of aspiration. Palliative care team to followup with patient and family.  Type II DM - Uncontrolled. However due to n.p.o. status, change SSI to every 4 hours.Continue Lantus. Reasonably controlled.  Failure to thrive - Palliative care team input and followup appreciated - Overall poor long-term prognosis  Hypertension: -Currently soft blood pressures. Carvedilol on hold  Code Status: DO NOT RESUSCITATE  Family Communication: Discussed with son Casimiro Needle via phone 12/27 Disposition Plan: To be determined. Remains in step down unit.    Consultants:  Palliative care team    nephrology  Critical care  medicine  Procedures:  None   Antibiotics:  IV Rocephin DC'd  IV vancomycin 12/26 >  IV Zosyn 12/26 >  Subjective: Aphasic/nonverbal. No acute events per nursing.  Objective: Filed Vitals:   02/21/13 0700 02/21/13 0734 02/21/13 0800 02/21/13 1127  BP: 121/60 121/60 103/56 122/49  Pulse: 79 88 86 85  Temp:  97.9 F (36.6 C)  97.6 F (36.4 C)  TempSrc:  Oral  Oral  Resp: 12 27 23 24   Height:      Weight:      SpO2: 100% 100% 100% 100%    Intake/Output Summary (Last 24 hours) at 02/21/13 1200 Last data filed at 02/21/13 1127  Gross per 24 hour  Intake 1633.2 ml  Output   1100 ml  Net  533.2 ml   Filed Weights   02/17/13 0348 02/18/13 0510 02/19/13 0440  Weight: 66.9 kg (147 lb 7.8 oz) 66.679 kg (147 lb) 65.953 kg (145 lb 6.4 oz)     Exam:  General exam: Chronically ill looking elderly male lying propped up in bed in minimal respiratory distress-Almost at baseline. Respiratory system:   Clear to auscultation anteriorly.Harsh breath sounds posteriorly with scattered bilateral crackles. No wheezing or rhonchi. Minimal increased work of breathing. Cardiovascular system: S1 & S2 heard, Irregularly irregular. No JVD, murmurs, gallops, clicks or pedal edema. telemetry: Reverted to sinus rhythm 2:40 AM on 12/28  Gastrointestinal system: Abdomen is nondistended, soft and nontender. Normal bowel sounds heard. Central nervous system: Somnolent but easily arousable, nonverbal and not following instructions today. No new focal neurological deficits. Extremities: Symmetric 4x 5 power.   Data Reviewed: Basic Metabolic Panel:  Recent Labs Lab 02/16/13 0450 02/17/13 0543  02/18/13 0915 02/19/13 0407 02/19/13 1928 02/20/13 0435 02/21/13 0535  NA 154* 163*  < > 160* 155* 141 142 142  K 4.5 3.7  < > 3.3* 3.5 3.5 3.5 3.1*  CL 118* 128*  < > 125* 123* 111 111 103  CO2 20 18*  < > 21 19 17* 16* 26  GLUCOSE 244* 163*  < > 180* 142* 351* 337* 180*  BUN 133* 122*  < > 108*  96* 84* 86* 83*  CREATININE 6.62* 6.20*  < > 5.45* 4.81* 4.36* 4.66* 4.63*  CALCIUM 7.7* 7.6*  < > 7.4* 7.4* 6.9* 6.7* 6.5*  PHOS  --  4.3  --   --  3.4  --  3.8 4.1  < > = values in this interval not displayed. Liver Function Tests:  Recent Labs Lab 02/15/13 1908 02/17/13 0543 02/19/13 0407 02/19/13 1928 02/20/13 0435 02/21/13 0535  AST 31  --   --  16  --   --   ALT 24  --   --  14  --   --   ALKPHOS 76  --   --  70  --   --   BILITOT 0.4  --   --  0.3  --   --   PROT 6.0  --   --  5.0*  --   --   ALBUMIN 2.0* 1.8* 1.6* 1.4* 1.3* 1.3*   No results found for this basename: LIPASE, AMYLASE,  in the last 168 hours No results found for this basename: AMMONIA,  in the last  168 hours CBC:  Recent Labs Lab 02/15/13 1908 02/16/13 0450 02/17/13 0543 02/18/13 0915 02/19/13 1928 02/21/13 0535  WBC 13.5* 9.8 7.7 13.2* 16.7* 14.3*  NEUTROABS 10.1*  --   --   --   --   --   HGB 11.9* 11.4* 10.6* 11.5* 10.4* 10.6*  HCT 38.0* 36.2* 33.3* 36.4* 32.7* 32.8*  MCV 97.4 96.3 94.9 96.0 95.9 92.1  PLT 235 236 298 282 289 301   Cardiac Enzymes:  Recent Labs Lab 02/15/13 1908 02/19/13 2250 02/20/13 0143 02/20/13 0910  TROPONINI <0.30 0.38* 0.58* 0.70*   BNP (last 3 results)  Recent Labs  02/15/13 1908  PROBNP 987.2*   CBG:  Recent Labs Lab 02/20/13 1526 02/20/13 2028 02/21/13 0011 02/21/13 0325 02/21/13 0730  GLUCAP 117* 160* 141* 171* 155*    Recent Results (from the past 240 hour(s))  URINE CULTURE     Status: None   Collection Time    02/15/13  8:30 PM      Result Value Range Status   Specimen Description URINE, CATHETERIZED   Final   Special Requests NONE   Final   Culture  Setup Time     Final   Value: 02/16/2013 01:51     Performed at [facility] Count     Final   Value: NO GROWTH     Performed at [facility]   Culture     Final   Value: NO GROWTH     Performed at [facility]   Report Status 02/16/2013 FINAL    Final  CULTURE, BLOOD (ROUTINE X 2)     Status: None   Collection Time    02/15/13  8:55 PM      Result Value Range Status   Specimen Description BLOOD ARM RIGHT   Final   Special Requests BOTTLES DRAWN AEROBIC AND ANAEROBIC 10CC   Final   Culture  Setup Time     Final   Value: 02/16/2013 04:12     Performed at [facility]   Culture     Final   Value:        BLOOD CULTURE RECEIVED NO GROWTH TO DATE CULTURE WILL BE HELD FOR 5 DAYS BEFORE ISSUING A FINAL NEGATIVE REPORT     Performed at [facility]   Report Status PENDING   Incomplete  CULTURE, BLOOD (ROUTINE X 2)     Status: None   Collection Time    02/15/13  9:05 PM      Result Value Range Status   Specimen Description BLOOD ARM LEFT   Final   Special Requests BOTTLES DRAWN AEROBIC ONLY 10CC   Final   Culture  Setup Time     Final   Value: 02/16/2013 04:12     Performed at [facility]   Culture     Final   Value:        BLOOD CULTURE RECEIVED NO GROWTH TO DATE CULTURE WILL BE HELD FOR 5 DAYS BEFORE ISSUING A FINAL NEGATIVE REPORT     Performed at [facility]   Report Status PENDING   Incomplete  MRSA PCR SCREENING     Status: Abnormal   Collection Time    02/16/13  1:08 AM      Result Value Range Status   MRSA by PCR POSITIVE (*) NEGATIVE Final   Comment:            The GeneXpert MRSA Assay (  FDA     approved for NASAL specimens     only), is one component of a     comprehensive MRSA colonization     surveillance program. It is not     intended to diagnose MRSA     infection nor to guide or     monitor treatment for     MRSA infections.     RESULT CALLED TO, READ BACK BY AND VERIFIED WITH:     HARAWAY,J,RN 0230 02/16/13 MITCHELL,L         Studies: Dg Chest Port 1 View  02/20/2013   CLINICAL DATA:  Aspiration pneumonia  EXAM: PORTABLE CHEST - 1 VIEW  COMPARISON:  February 19, 2013  FINDINGS: There is diffuse interstitial edema with slightly greater opacity in the bases compared  to elsewhere. Heart is upper normal in size. The pulmonary vascularity suggests mild pulmonary venous hypertension. No pneumothorax. No adenopathy.  IMPRESSION: The overall appearance suggests a degree of congestive heart failure. Patchy opacity in both lower lobes could represent underlying pneumonia and/or aspiration. There is slightly more opacity in the left base compared to 1 day prior which could represent either edema or possibly aspiration in the appropriate clinical setting.   Electronically Signed   By: Bretta Bang M.D.   On: 02/20/2013 08:45   Dg Chest Port 1 View  02/19/2013   CLINICAL DATA:  Rapid response. Respiratory distress. Evaluate for aspiration.  EXAM: PORTABLE CHEST - 1 VIEW  COMPARISON:  02/18/2013  FINDINGS: Heart size is normal. Small left pleural effusion is present. Lung volumes are low. There is chronic interstitial coarsening identified bilaterally suggestive of interstitial lung disease. There is an opacity within the left base which may represent pneumonia or aspiration.  IMPRESSION: 1. Left base opacity which may represent aspiration or pneumonia. 2. Chronic interstitial lung disease. 3. Left effusion.   Electronically Signed   By: Signa Kell M.D.   On: 02/19/2013 19:10        Scheduled Meds: . antiseptic oral rinse  15 mL Mouth Rinse BID  . aspirin  300 mg Rectal Daily  . heparin  5,000 Units Subcutaneous Q8H  . insulin aspart  0-9 Units Subcutaneous Q4H  . insulin glargine  15 Units Subcutaneous QHS  . piperacillin-tazobactam (ZOSYN)  IV  2.25 g Intravenous Q8H  . potassium chloride  10 mEq Intravenous Q1 Hr x 2  . sodium chloride  3 mL Intravenous Q12H   Continuous Infusions: . amiodarone (NEXTERONE PREMIX) 360 mg/200 mL dextrose 30 mg/hr (02/21/13 0030)  . dextrose 5 % with KCl 20 mEq / L 20 mEq (02/21/13 0726)    Principal Problem:   Renal failure, acute on chronic Active Problems:   Type II or unspecified type diabetes mellitus with  neurological manifestations, not stated as uncontrolled(250.60)   Essential hypertension, benign   Other dysphagia   CVA (cerebral vascular accident)   COPD (chronic obstructive pulmonary disease)   AKI (acute kidney injury)   CHF (congestive heart failure)   DNR (do not resuscitate)   Dehydration with hypernatremia   Dysphagia, unspecified(787.20)   Aspiration pneumonia   Acute respiratory failure with hypoxia   Atrial fibrillation with RVR    Time spent: 45 minutes    Armie Moren, MD, FACP, FHM. [facility] Pager [phone]  If 7PM-7AM, please contact night-coverage [email or web address] Password [email or web address] 02/21/2013, 12:00 PM    LOS: 6 days

## 2013-02-21 NOTE — Progress Notes (Signed)
Pt converted to NSR @0240 

## 2013-02-22 LAB — RENAL FUNCTION PANEL
CO2: 26 mEq/L (ref 19–32)
Creatinine, Ser: 4.78 mg/dL — ABNORMAL HIGH (ref 0.50–1.35)
GFR calc Af Amer: 13 mL/min — ABNORMAL LOW (ref >90)
GFR calc non Af Amer: 11 mL/min — ABNORMAL LOW (ref >90)
Glucose, Bld: 129 mg/dL — ABNORMAL HIGH (ref 70–99)
Potassium: 3.5 mEq/L (ref 3.5–5.1)
Sodium: 143 mEq/L (ref 135–145)

## 2013-02-22 LAB — GLUCOSE, CAPILLARY
Glucose-Capillary: 124 mg/dL — ABNORMAL HIGH (ref 70–99)
Glucose-Capillary: 135 mg/dL — ABNORMAL HIGH (ref 70–99)
Glucose-Capillary: 143 mg/dL — ABNORMAL HIGH (ref 70–99)
Glucose-Capillary: 147 mg/dL — ABNORMAL HIGH (ref 70–99)

## 2013-02-22 LAB — CULTURE, BLOOD (ROUTINE X 2): Culture: NO GROWTH

## 2013-02-22 MED ORDER — POTASSIUM CL IN DEXTROSE 5% 20 MEQ/L IV SOLN
20.0000 meq | INTRAVENOUS | Status: DC
  Administered 2013-02-22 – 2013-02-23 (×2): 20 meq via INTRAVENOUS
  Filled 2013-02-22 (×14): qty 1000

## 2013-02-22 NOTE — Progress Notes (Signed)
[facility] PROGRESS NOTE    Eric Graves ZOX:[id number] DOB: May 07, 1940 DOA: 02/15/2013 PCP: Bufford Spikes, DO  HPI/Brief narrative 72 y.o. male with hx of HTN, CVA with left side hemiplegia, dysphagia requiring nectar thickened food, HTN, hx of COPD, brought to the ER as he was having increase shortness of breath. He does not communicate and denied he was having shortness of breath. Evalaution in the ER showed CXR with mild vascular congestion, AKI with Cr of 6.64 and BUN of 132. His baseline Cr was 1.7. He has a WBC of 13K, a Na of 155, and normal LFTs. Hospitalist was asked to admit him for AKI. He was started on VAN/Zosyn for ? PNA.  Assessment/Plan:  Aspiration pneumonia with sepsis/Dysphagia - On 12/26, following initiation of dysphagia diet, patient seems to have aspirated and went into acute respiratory failure and rapid A. Fib. - Changed to n.p.o. Status, broadened the antibiotic spectrum Zosyn and vancomycin, pulmonary toilet - Respiratory status overall is tenuous but slightly improved compared to 2 nights ago. - blood cultures continued to be negative, will DC vancomycin and continue vancomycin alone.  Acute respiratory failure - Apparently present on admission: He was placed on BiPAP on route to ED. - Most likely secondary to aspiration pneumonia and less likely due to pulmonary edema - Please see above - After going back and forth with family, patient currently is DO NOT RESUSCITATE/DO NOT INTUBATE - No improvement in respiratory status - Aggressive pulmonary toilet  A. Fib with RVR - Started 12/26 PM. Likely precipitated by acute respiratory events. - He did not respond to IV metoprolol. Transferred to the step down unit and placed on Cardizem drip. He was weaned off Cardizem drip briefly but then went back into rapid A. fib on 12/27 AM. CCM started amiodarone. He has reverted to sinus rhythm on 12/28 at 2:40 a.m.  - 2-D echo: EF 55-60%. Wall motion was normal;  there were no regional wall motion abnormalities.  - Mildly elevated troponin likely secondary to stress of RVR and aspiration pneumonia. He will not be a candidate for aggressive ischemic evaluation.  Hypotension - Likely secondary to A. Fib with RVR and sepsis from aspiration pneumonia - resolved  Anion gap metabolic acidosis - Likely secondary to renal failure. Lactate and serum acetones normal. Less likely due to DKA. - Resolved after bicarbonate drip. DCed bicarbonate  Acute on chronic renal failure-Principal admitting diagnosis - Likely secondary to volume depletion - Baseline creatinine in H&P was recorded as 1.7 but unsure as to when and where it was obtained from. - Nephrology consultation & followup appreciated. - Creatinine continues to improve. Trend BMP - Patient was oliguric all day yesterday:? Element of ATN. He did make 500 mL of urine last night. - Continue gentle IV fluids. Not candidate for hemodialysis. - Creatinine slowly worsening  Hypokalemia - Repleted  Hypernatremic dehydration - Hypernatremia resolved. Resolved  Anemia - Possibly chronic.  stable   Dysphagia - Aspirated on dysphagia diet. N.p.o. Discussed at length with 2 sons on 12/26 PM. Henceforth, he probably will not be a good candidate for oral intake given high risk for recurrent aspiration. Advised them that PEG tube will also carry some risk of aspiration. Palliative care team to followup with patient and family.  Type II DM - Uncontrolled. However due to n.p.o. status, change SSI to every 4 hours.Continue Lantus. Reasonably controlled.  Failure to thrive - Palliative care team input and followup appreciated - Overall poor long-term prognosis  Hypertension: -Currently  soft blood pressures. Carvedilol on hold  Code Status: DO NOT RESUSCITATE  Family Communication: Discussed with son Casimiro Needle via phone 12/27 Disposition Plan: To be determined. Remains in step down unit.     Consultants:  Palliative care team    nephrology  Critical care medicine  Procedures:  None   Antibiotics:  IV Rocephin DC'd  IV vancomycin 12/26 >  IV Zosyn 12/26 >  Subjective: Aphasic/nonverbal. Per nursing, was not as responsive last night as night before.  Objective: Filed Vitals:   02/21/13 1535 02/21/13 2038 02/22/13 0025 02/22/13 0443  BP: 140/52 125/65 120/62 140/61  Pulse: 89 89 90 89  Temp: 97.7 F (36.5 C) 98.1 F (36.7 C) 98.1 F (36.7 C) 97.1 F (36.2 C)  TempSrc: Oral Axillary Axillary Axillary  Resp: 21 24 21 16   Height:      Weight:      SpO2: 100% 100% 98% 95%    Intake/Output Summary (Last 24 hours) at 02/22/13 0753 Last data filed at 02/22/13 0600  Gross per 24 hour  Intake  920.4 ml  Output   1325 ml  Net -404.6 ml   Filed Weights   02/17/13 0348 02/18/13 0510 02/19/13 0440  Weight: 66.9 kg (147 lb 7.8 oz) 66.679 kg (147 lb) 65.953 kg (145 lb 6.4 oz)     Exam:  General exam: Chronically ill looking elderly male lying propped up in bed in minimal respiratory distress-Almost at baseline. Respiratory system:   Reduced BS's B/L and scattered crackle posteriorly. No wheezing or rhonchi. Minimal increased work of breathing. Cardiovascular system: S1 & S2 heard, Irregularly irregular. No JVD, murmurs, gallops, clicks or pedal edema. telemetry: SR Gastrointestinal system: Abdomen is nondistended, soft and nontender. Normal bowel sounds heard. Central nervous system: Somnolent but easily arousable, nonverbal and not following instructions today. No new focal neurological deficits. Extremities: Symmetric 4x 5 power.   Data Reviewed: Basic Metabolic Panel:  Recent Labs Lab 02/17/13 0543  02/19/13 0407 02/19/13 1928 02/20/13 0435 02/21/13 0535 02/22/13 0405  NA 163*  < > 155* 141 142 142 143  K 3.7  < > 3.5 3.5 3.5 3.1* 3.5  CL 128*  < > 123* 111 111 103 102  CO2 18*  < > 19 17* 16* 26 26  GLUCOSE 163*  < > 142* 351* 337*  180* 129*  BUN 122*  < > 96* 84* 86* 83* 75*  CREATININE 6.20*  < > 4.81* 4.36* 4.66* 4.63* 4.78*  CALCIUM 7.6*  < > 7.4* 6.9* 6.7* 6.5* 6.9*  MG  --   --   --   --   --   --  1.5  PHOS 4.3  --  3.4  --  3.8 4.1 4.2  < > = values in this interval not displayed. Liver Function Tests:  Recent Labs Lab 02/15/13 1908  02/19/13 0407 02/19/13 1928 02/20/13 0435 02/21/13 0535 02/22/13 0405  AST 31  --   --  16  --   --   --   ALT 24  --   --  14  --   --   --   ALKPHOS 76  --   --  70  --   --   --   BILITOT 0.4  --   --  0.3  --   --   --   PROT 6.0  --   --  5.0*  --   --   --   ALBUMIN 2.0*  < >  1.6* 1.4* 1.3* 1.3* 1.4*  < > = values in this interval not displayed. No results found for this basename: LIPASE, AMYLASE,  in the last 168 hours No results found for this basename: AMMONIA,  in the last 168 hours CBC:  Recent Labs Lab 02/15/13 1908 02/16/13 0450 02/17/13 0543 02/18/13 0915 02/19/13 1928 02/21/13 0535  WBC 13.5* 9.8 7.7 13.2* 16.7* 14.3*  NEUTROABS 10.1*  --   --   --   --   --   HGB 11.9* 11.4* 10.6* 11.5* 10.4* 10.6*  HCT 38.0* 36.2* 33.3* 36.4* 32.7* 32.8*  MCV 97.4 96.3 94.9 96.0 95.9 92.1  PLT 235 236 298 282 289 301   Cardiac Enzymes:  Recent Labs Lab 02/15/13 1908 02/19/13 2250 02/20/13 0143 02/20/13 0910  TROPONINI <0.30 0.38* 0.58* 0.70*   BNP (last 3 results)  Recent Labs  02/15/13 1908  PROBNP 987.2*   CBG:  Recent Labs Lab 02/21/13 1124 02/21/13 1538 02/21/13 2035 02/22/13 0021 02/22/13 0427  GLUCAP 105* 91 102* 135* 124*    Recent Results (from the past 240 hour(s))  URINE CULTURE     Status: None   Collection Time    02/15/13  8:30 PM      Result Value Range Status   Specimen Description URINE, CATHETERIZED   Final   Special Requests NONE   Final   Culture  Setup Time     Final   Value: 02/16/2013 01:51     Performed at [facility] Count     Final   Value: NO GROWTH     Performed at [facility]   Culture     Final   Value: NO GROWTH     Performed at [facility]   Report Status 02/16/2013 FINAL   Final  CULTURE, BLOOD (ROUTINE X 2)     Status: None   Collection Time    02/15/13  8:55 PM      Result Value Range Status   Specimen Description BLOOD ARM RIGHT   Final   Special Requests BOTTLES DRAWN AEROBIC AND ANAEROBIC 10CC   Final   Culture  Setup Time     Final   Value: 02/16/2013 04:12     Performed at [facility]   Culture     Final   Value:        BLOOD CULTURE RECEIVED NO GROWTH TO DATE CULTURE WILL BE HELD FOR 5 DAYS BEFORE ISSUING A FINAL NEGATIVE REPORT     Performed at [facility]   Report Status PENDING   Incomplete  CULTURE, BLOOD (ROUTINE X 2)     Status: None   Collection Time    02/15/13  9:05 PM      Result Value Range Status   Specimen Description BLOOD ARM LEFT   Final   Special Requests BOTTLES DRAWN AEROBIC ONLY 10CC   Final   Culture  Setup Time     Final   Value: 02/16/2013 04:12     Performed at [facility]   Culture     Final   Value:        BLOOD CULTURE RECEIVED NO GROWTH TO DATE CULTURE WILL BE HELD FOR 5 DAYS BEFORE ISSUING A FINAL NEGATIVE REPORT     Performed at [facility]   Report Status PENDING   Incomplete  MRSA PCR SCREENING     Status: Abnormal   Collection Time  02/16/13  1:08 AM      Result Value Range Status   MRSA by PCR POSITIVE (*) NEGATIVE Final   Comment:            The GeneXpert MRSA Assay (FDA     approved for NASAL specimens     only), is one component of a     comprehensive MRSA colonization     surveillance program. It is not     intended to diagnose MRSA     infection nor to guide or     monitor treatment for     MRSA infections.     RESULT CALLED TO, READ BACK BY AND VERIFIED WITH:     HARAWAY,J,RN 0230 02/16/13 MITCHELL,L  CULTURE, BLOOD (ROUTINE X 2)     Status: None   Collection Time    02/19/13  8:50 PM      Result Value Range Status   Specimen  Description BLOOD HAND RIGHT   Final   Special Requests BOTTLES DRAWN AEROBIC ONLY 2CC   Final   Culture  Setup Time     Final   Value: 02/20/2013 01:32     Performed at [facility]   Culture     Final   Value:        BLOOD CULTURE RECEIVED NO GROWTH TO DATE CULTURE WILL BE HELD FOR 5 DAYS BEFORE ISSUING A FINAL NEGATIVE REPORT     Performed at [facility]   Report Status PENDING   Incomplete  CULTURE, BLOOD (ROUTINE X 2)     Status: None   Collection Time    02/19/13 10:50 PM      Result Value Range Status   Specimen Description BLOOD RIGHT HAND   Final   Special Requests BOTTLES DRAWN AEROBIC ONLY 0.5CC   Final   Culture  Setup Time     Final   Value: 02/20/2013 12:50     Performed at [facility]   Culture     Final   Value:        BLOOD CULTURE RECEIVED NO GROWTH TO DATE CULTURE WILL BE HELD FOR 5 DAYS BEFORE ISSUING A FINAL NEGATIVE REPORT     Performed at [facility]   Report Status PENDING   Incomplete         Studies: Dg Chest Port 1 View  02/20/2013   CLINICAL DATA:  Aspiration pneumonia  EXAM: PORTABLE CHEST - 1 VIEW  COMPARISON:  February 19, 2013  FINDINGS: There is diffuse interstitial edema with slightly greater opacity in the bases compared to elsewhere. Heart is upper normal in size. The pulmonary vascularity suggests mild pulmonary venous hypertension. No pneumothorax. No adenopathy.  IMPRESSION: The overall appearance suggests a degree of congestive heart failure. Patchy opacity in both lower lobes could represent underlying pneumonia and/or aspiration. There is slightly more opacity in the left base compared to 1 day prior which could represent either edema or possibly aspiration in the appropriate clinical setting.   Electronically Signed   By: Bretta Bang M.D.   On: 02/20/2013 08:45        Scheduled Meds: . antiseptic oral rinse  15 mL Mouth Rinse BID  . aspirin  300 mg Rectal Daily  . heparin  5,000 Units  Subcutaneous Q8H  . insulin aspart  0-9 Units Subcutaneous Q4H  . insulin glargine  15 Units Subcutaneous QHS  . piperacillin-tazobactam (ZOSYN)  IV  2.25 g Intravenous Q8H  . sodium  chloride  3 mL Intravenous Q12H   Continuous Infusions: . amiodarone (NEXTERONE PREMIX) 360 mg/200 mL dextrose 30 mg/hr (02/22/13 0509)  . dextrose 5 % with KCl 20 mEq / L 20 mEq (02/21/13 0726)    Principal Problem:   Renal failure, acute on chronic Active Problems:   Type II or unspecified type diabetes mellitus with neurological manifestations, not stated as uncontrolled(250.60)   Essential hypertension, benign   Other dysphagia   CVA (cerebral vascular accident)   COPD (chronic obstructive pulmonary disease)   AKI (acute kidney injury)   CHF (congestive heart failure)   DNR (do not resuscitate)   Dehydration with hypernatremia   Dysphagia, unspecified(787.20)   Aspiration pneumonia   Acute respiratory failure with hypoxia   Atrial fibrillation with RVR    Time spent: 45 minutes    Franny Selvage, MD, FACP, FHM. [facility] Pager [phone]  If 7PM-7AM, please contact night-coverage [email or web address] Password [email or web address] 02/22/2013, 7:53 AM    LOS: 7 days

## 2013-02-22 NOTE — Progress Notes (Signed)
Utilization review completed.  

## 2013-02-22 NOTE — Progress Notes (Signed)
Assessment/ Plan:   1. Nonoliguric Acute renal failure chronic kidney disease stage III.  Will continue to hydrate. 2. Hypertension/volume - initially hypernatremic from water deficiency-improved with intravenous fluid.  3. Pneumonia-aspiration versus healthcare associated-on broad-spectrum antibiotic coverage with Zosyn .Patient is a DNR 4. Anemia - 5. Hypokalemia - replete via by mouth/IV route  6. Metabolic Acidosis: Improved with intravenous sodium bicarbonate-we'll reassess fluids is metabolic alkalosis may be suppressive to his respiratory drive.   Subjective: Interval History: None  Objective: Vital signs in last 24 hours: Temp:  [97.1 F (36.2 C)-98.1 F (36.7 C)] 97.7 F (36.5 C) (12/29 0818) Pulse Rate:  [85-90] 86 (12/29 0818) Resp:  [16-24] 16 (12/29 0818) BP: (120-155)/(49-65) 155/61 mmHg (12/29 0818) SpO2:  [95 %-100 %] 98 % (12/29 0818) FiO2 (%):  [6 %-35 %] 35 % (12/29 0818) Weight change:   Intake/Output from previous day: 12/28 0701 - 12/29 0700 In: 997.1 [I.V.:997.1] Out: 1500 [Urine:1500] Intake/Output this shift: Total I/O In: 1683.7 [I.V.:1683.7] Out: 75 [Urine:75]  General appearance: slowed mentation Chest wall: no tenderness GI: soft, non-tender; bowel sounds normal; no masses,  no organomegaly Extremities: extremities normal, atraumatic, no cyanosis or edema  Lab Results:  Recent Labs  02/19/13 1928 02/21/13 0535  WBC 16.7* 14.3*  HGB 10.4* 10.6*  HCT 32.7* 32.8*  PLT 289 301   BMET:  Recent Labs  02/21/13 0535 02/22/13 0405  NA 142 143  K 3.1* 3.5  CL 103 102  CO2 26 26  GLUCOSE 180* 129*  BUN 83* 75*  CREATININE 4.63* 4.78*  CALCIUM 6.5* 6.9*   No results found for this basename: PTH,  in the last 72 hours Iron Studies: No results found for this basename: IRON, TIBC, TRANSFERRIN, FERRITIN,  in the last 72 hours Studies/Results: No results found.  Scheduled: . antiseptic oral rinse  15 mL Mouth Rinse BID  . aspirin  300  mg Rectal Daily  . heparin  5,000 Units Subcutaneous Q8H  . insulin aspart  0-9 Units Subcutaneous Q4H  . insulin glargine  15 Units Subcutaneous QHS  . piperacillin-tazobactam (ZOSYN)  IV  2.25 g Intravenous Q8H  . sodium chloride  3 mL Intravenous Q12H     LOS: 7 days   Olimpia Tinch C 02/22/2013,11:11 AM

## 2013-02-22 NOTE — Progress Notes (Signed)
NUTRITION FOLLOW UP  Intervention:    Diet advancement with known risk of aspiration vs. feeding tube placement pending discussion with family.  Nutrition Dx   Inadequate oral intake related to difficulty swallowing as evidenced by NPO status.  Goal:   Intake to meet >90% of estimated nutrition needs. Unmet.  Monitor:   Diet advancement vs need to start TF, intake, labs, weight trend.  Assessment:   Patient is an 72 y.o. male with hx of HTN, CVA with left side hemiplegia, dysphagia requiring nectar thickened food, HTN, hx of COPD, brought to the ER as he was having increase shortness of breath. He does not communicate and denied he was having shortness of breath. Evalaution in the ER showed CXR with mild vascular congestion, AKI with Cr of 6.64 and BUN of 132. His baseline Cr was 1.7. He has a WBC of 13K, a Na of 155, and normal LFTs. Hospitalist was asked to admit him for AKI. He was started on VAN/Zosyn for ? PNA.  Previously on dysphagia 2 diet with nectar thick liquids; now NPO due to increased risk of dysphagia. SLP following; patient with an increased risk of aspiration with all PO's. Patient is a DNR now. Palliative Care Team is following; to discuss options regarding PEG with patient's family.   Height: Ht Readings from Last 1 Encounters:  02/15/13 5\' 5"  (1.651 m)    Weight Status:  stable Wt Readings from Last 1 Encounters:  02/19/13 145 lb 6.4 oz (65.953 kg)  02/16/13  145 lb 4.5 oz (65.9 kg)   Re-estimated needs:  Kcal: 1700-1900  Protein: 65- 80 gram  Fluid: 1.7 L  Skin: stage 2 wound on right and left ear; stage 2 pressure ulcer on right buttocks  Diet Order:  NPO   Intake/Output Summary (Last 24 hours) at 02/22/13 1002 Last data filed at 02/22/13 0900  Gross per 24 hour  Intake 2680.8 ml  Output   1400 ml  Net 1280.8 ml    Last BM: 12/28   Labs:   Recent Labs Lab 02/20/13 0435 02/21/13 0535 02/22/13 0405  NA 142 142 143  K 3.5 3.1* 3.5  CL  111 103 102  CO2 16* 26 26  BUN 86* 83* 75*  CREATININE 4.66* 4.63* 4.78*  CALCIUM 6.7* 6.5* 6.9*  MG  --   --  1.5  PHOS 3.8 4.1 4.2  GLUCOSE 337* 180* 129*    CBG (last 3)   Recent Labs  02/22/13 0021 02/22/13 0427 02/22/13 0816  GLUCAP 135* 124* 122*    Scheduled Meds: . antiseptic oral rinse  15 mL Mouth Rinse BID  . aspirin  300 mg Rectal Daily  . heparin  5,000 Units Subcutaneous Q8H  . insulin aspart  0-9 Units Subcutaneous Q4H  . insulin glargine  15 Units Subcutaneous QHS  . piperacillin-tazobactam (ZOSYN)  IV  2.25 g Intravenous Q8H  . sodium chloride  3 mL Intravenous Q12H    Continuous Infusions: . amiodarone (NEXTERONE PREMIX) 360 mg/200 mL dextrose 30 mg/hr (02/22/13 0900)  . dextrose 5 % with KCl 20 mEq / L 20 mEq (02/21/13 0726)    Joaquin Courts, RD, LDN, CNSC Pager [phone] After Hours Pager [phone]

## 2013-02-23 DIAGNOSIS — N189 Chronic kidney disease, unspecified: Secondary | ICD-10-CM

## 2013-02-23 DIAGNOSIS — J690 Pneumonitis due to inhalation of food and vomit: Secondary | ICD-10-CM

## 2013-02-23 DIAGNOSIS — J9600 Acute respiratory failure, unspecified whether with hypoxia or hypercapnia: Secondary | ICD-10-CM

## 2013-02-23 DIAGNOSIS — I4891 Unspecified atrial fibrillation: Secondary | ICD-10-CM

## 2013-02-23 DIAGNOSIS — N179 Acute kidney failure, unspecified: Secondary | ICD-10-CM

## 2013-02-23 LAB — GLUCOSE, CAPILLARY
Glucose-Capillary: 151 mg/dL — ABNORMAL HIGH (ref 70–99)
Glucose-Capillary: 120 mg/dL — ABNORMAL HIGH (ref 70–99)

## 2013-02-23 LAB — CBC
HCT: 35.2 % — ABNORMAL LOW (ref 39.0–52.0)
MCH: 29.9 pg (ref 26.0–34.0)
MCV: 91.4 fL (ref 78.0–100.0)
Platelets: 412 10*3/uL — ABNORMAL HIGH (ref 150–400)
RBC: 3.85 MIL/uL — ABNORMAL LOW (ref 4.22–5.81)
WBC: 11.5 10*3/uL — ABNORMAL HIGH (ref 4.0–10.5)

## 2013-02-23 LAB — RENAL FUNCTION PANEL
BUN: 65 mg/dL — ABNORMAL HIGH (ref 6–23)
CO2: 24 mEq/L (ref 19–32)
Calcium: 7 mg/dL — ABNORMAL LOW (ref 8.4–10.5)
Chloride: 99 mEq/L (ref 96–112)
Creatinine, Ser: 4.38 mg/dL — ABNORMAL HIGH (ref 0.50–1.35)
GFR calc Af Amer: 14 mL/min — ABNORMAL LOW (ref >90)
GFR calc non Af Amer: 12 mL/min — ABNORMAL LOW (ref >90)
Sodium: 140 mEq/L (ref 137–147)

## 2013-02-23 MED ORDER — DEXTROSE 50 % IV SOLN
INTRAVENOUS | Status: AC
  Administered 2013-02-23: 25 mL via INTRAVENOUS
  Filled 2013-02-23: qty 50

## 2013-02-23 MED ORDER — DEXTROSE 50 % IV SOLN
25.0000 mL | Freq: Once | INTRAVENOUS | Status: AC | PRN
  Administered 2013-02-23: 25 mL via INTRAVENOUS

## 2013-02-23 NOTE — Progress Notes (Signed)
Assessment/ Plan:   1. Nonoliguric Acute renal failure chronic kidney disease stage III.   2. Hypertension/volume - initially hypernatremic from water deficiency-improved with intravenous fluid.  3. Pneumonia-aspiration versus healthcare associated-on broad-spectrum antibiotic coverage with Zosyn .Patient is a DNR   Rec: Judicious fluid administration.  We will sign off given good UOP and expected recovery of renal function.  Subjective: Interval History: None  Objective: Vital signs in last 24 hours: Temp:  [97.1 F (36.2 C)-98.7 F (37.1 C)] 97.1 F (36.2 C) (12/30 0805) Pulse Rate:  [83-98] 83 (12/30 0805) Resp:  [12-21] 13 (12/30 0805) BP: (137-161)/(60-80) 142/80 mmHg (12/30 0805) SpO2:  [97 %-100 %] 100 % (12/30 0805) FiO2 (%):  [35 %] 35 % (12/29 1318) Weight change:   Intake/Output from previous day: 12/29 0701 - 12/30 0700 In: 3684.3 [I.V.:3684.3] Out: 1200 [Urine:1200] Intake/Output this shift:    General appearance: not reponsive GI: soft, non-tender; bowel sounds normal; no masses,  no organomegaly Extremities: LUE with swelling of arm, BLE without edema  Lab Results:  Recent Labs  02/21/13 0535 02/23/13 0518  WBC 14.3* 11.5*  HGB 10.6* 11.5*  HCT 32.8* 35.2*  PLT 301 412*   BMET:  Recent Labs  02/22/13 0405 02/23/13 0518  NA 143 140  K 3.5 4.0  CL 102 99  CO2 26 24  GLUCOSE 129* 161*  BUN 75* 65*  CREATININE 4.78* 4.38*  CALCIUM 6.9* 7.0*   No results found for this basename: PTH,  in the last 72 hours Iron Studies: No results found for this basename: IRON, TIBC, TRANSFERRIN, FERRITIN,  in the last 72 hours Studies/Results: No results found.  Scheduled: . antiseptic oral rinse  15 mL Mouth Rinse BID  . aspirin  300 mg Rectal Daily  . heparin  5,000 Units Subcutaneous Q8H  . insulin aspart  0-9 Units Subcutaneous Q4H  . insulin glargine  15 Units Subcutaneous QHS  . piperacillin-tazobactam (ZOSYN)  IV  2.25 g Intravenous Q8H  . sodium  chloride  3 mL Intravenous Q12H     LOS: 8 days   Tashaya Ancrum C 02/23/2013,9:56 AM

## 2013-02-23 NOTE — Progress Notes (Signed)
Patient [id number] Eric Graves      DOB: Mar 07, 1940      IEP:[id number]  Full note to follow remet with both sons and dil.  Eric Graves is unable to come to terms with making a decision to move to full comfort. They are requesting status quo treatment while they discuss this.  See addendum to follow for more detains.   Total time: 600 pm-700 pm  Daissy Yerian L. Ladona Ridgel, MD MBA The Palliative Medicine Team at [facility] Phone: [phone] Pager: [phone]

## 2013-02-23 NOTE — Clinical Social Work Note (Signed)
CSW aware that patient is from [facility]. CSW will wait for family to have discussions with PMT before approaching family about return to SNF/assessment. CSW will continue to follow.   Roddie Mc, [place], [place], [phone]

## 2013-02-23 NOTE — Progress Notes (Signed)
[facility] PROGRESS NOTE    Bard Haupert ZOX:[id number] DOB: 20-Apr-1940 DOA: 02/15/2013 PCP: Bufford Spikes, DO  HPI/Brief narrative 72 y.o. male with hx of HTN, CVA with left side hemiplegia, dysphagia requiring nectar thickened food, HTN, hx of COPD, brought to the ER as he was having increase shortness of breath. He does not communicate and denied he was having shortness of breath. Evalaution in the ER showed CXR with mild vascular congestion, AKI with Cr of 6.64 and BUN of 132. His baseline Cr was 1.7. He has a WBC of 13K, a Na of 155, and normal LFTs. He was admitted for hypernatremic dehydration, acute on chronic renal failure and UTI. He failed swallow evaluation initially. She was hydrated with hypotonic IV fluids and sodium normalized. Creatinine started improving. He was started on dysphagia diet and aspirated. He went into acute hypoxic respiratory failure which precipitated A. fib with RVR. He was transferred to step down unit. He remains on IV antibiotics, amiodarone, fluids, n.p.o. No significant change in his mental status, respiratory status and creatinines have plateaued. Palliative care team are on board and await further discussion with family and recommendations. Nephrology and CCM signed off. Overall poor prognosis.   Assessment/Plan:  Aspiration pneumonia with sepsis/Dysphagia - On 12/26, following initiation of dysphagia diet, patient seems to have aspirated and went into acute respiratory failure and rapid A. Fib. - Changed to n.p.o. Status, broadened the antibiotic spectrum Zosyn and vancomycin, pulmonary toilet - Respiratory status overall is tenuous. - blood cultures continued to be negative, will DC vancomycin and continue with Zosyn alone.  Acute respiratory failure - Apparently present on admission: He was placed on BiPAP on route to ED. - Most likely secondary to aspiration pneumonia and less likely due to pulmonary edema - Please see above - After going  back and forth with family, patient currently is DO NOT RESUSCITATE/DO NOT INTUBATE - No improvement in respiratory status. Remains on oxygen via facemask. - Aggressive pulmonary toilet  A. Fib with RVR - Started 12/26 PM. Likely precipitated by acute respiratory events. - He did not respond to IV metoprolol. Transferred to the step down unit and placed on Cardizem drip. He was weaned off Cardizem drip briefly but then went back into rapid A. fib on 12/27 AM. CCM started amiodarone. He has reverted to sinus rhythm on 12/28 at 2:40 a.m.  - 2-D echo: EF 55-60%. Wall motion was normal; there were no regional wall motion abnormalities.  - Mildly elevated troponin likely secondary to stress of RVR and aspiration pneumonia. He will not be a candidate for aggressive ischemic evaluation. - Remains in sinus rhythm on amiodarone drip. As per nursing, patient just lost his peripheral IV access. IV team is going to try again but his arms are swollen which may make it difficult. If no IV access, only option would be a central line.  Hypotension - Likely secondary to A. Fib with RVR and sepsis from aspiration pneumonia - resolved  Anion gap metabolic acidosis - Likely secondary to renal failure. Lactate and serum acetones normal. Less likely due to DKA. - Resolved after bicarbonate drip. DCed bicarbonate  Acute on chronic renal failure-Principal admitting diagnosis - Likely secondary to volume depletion - Baseline creatinine in H&P was recorded as 1.7 but unsure as to when and where it was obtained from. - Nephrology consultation & followup appreciated. - Creatinine continues to improve. Trend BMP - Patient was oliguric all day yesterday:? Element of ATN. He did make 500 mL of  urine last night. - Continue gentle IV fluids. Not candidate for hemodialysis. - Creatinine seems to have plateaued. Nephrology signed off.  Hypokalemia - Repleted  Hypernatremic dehydration - Hypernatremia resolved.  Resolved  Anemia - Possibly chronic.  stable   Dysphagia - Aspirated on dysphagia diet. N.p.o. Discussed at length with 2 sons on 12/26 PM. Henceforth, he probably will not be a good candidate for oral intake given high risk for recurrent aspiration. Advised them that PEG tube will also carry some risk of aspiration. Palliative care team to followup with patient and family.  Type II DM - Uncontrolled. However due to n.p.o. status, change SSI to every 4 hours.Continue Lantus. Reasonably controlled.  Failure to thrive - Palliative care team input and followup appreciated - Overall poor long-term prognosis - Discussed at length with son/health care power of attorney Mr. Jayme Cham on 02/23/13 and advised him that patient is not making any significant improvement and in fact declining, has not had any form of nutrition for the last week and asked him if he and family had had any further discussions regarding further course for patient. He stated that he had none. Discussed with him about seemingly futile aggressive management at this time and to consider comfort oriented care. Palliative care at team to followup.  Hypertension: -Currently soft blood pressures. Carvedilol on hold  Code Status: DO NOT RESUSCITATE  Family Communication: Discussed with son Casimiro Needle via phone 12/30 Disposition Plan: To be determined. Remains in step down unit.    Consultants:  Palliative care team    nephrology  Critical care medicine  Procedures:  None   Antibiotics:  IV Rocephin DC'd  IV vancomycin 12/26 >  IV Zosyn 12/26 >  Subjective: Aphasic/nonverbal. Per nursing, not very responsive for the last 2 days.  Objective: Filed Vitals:   02/22/13 2357 02/23/13 0320 02/23/13 0805 02/23/13 1144  BP: 137/71 155/71 142/80 139/73  Pulse: 98 88 83 92  Temp: 97.4 F (36.3 C) 98.7 F (37.1 C) 97.1 F (36.2 C) 97 F (36.1 C)  TempSrc: Axillary Oral Axillary Axillary  Resp: 12 16 13 14    Height:      Weight:      SpO2: 98% 99% 100% 99%    Intake/Output Summary (Last 24 hours) at 02/23/13 1422 Last data filed at 02/23/13 1200  Gross per 24 hour  Intake 2117.3 ml  Output   1375 ml  Net  742.3 ml   Filed Weights   02/17/13 0348 02/18/13 0510 02/19/13 0440  Weight: 66.9 kg (147 lb 7.8 oz) 66.679 kg (147 lb) 65.953 kg (145 lb 6.4 oz)     Exam:  General exam: Chronically ill looking elderly male lying propped up in bed in no obvious distress Respiratory system:   Reduced BS's B/L and scattered crackle posteriorly. No wheezing or rhonchi. No increased work of breathing. Cardiovascular system: S1 & S2 heard, RRR. No JVD, murmurs, gallops, clicks or pedal edema. telemetry: SR Gastrointestinal system: Abdomen is nondistended, soft and nontender. Normal bowel sounds heard. Central nervous system: Somnolent but easily arousable, nonverbal and looks around and drifts back to sleep. No new focal neurological deficits. Extremities: Symmetric 4x 5 power.   Data Reviewed: Basic Metabolic Panel:  Recent Labs Lab 02/19/13 0407 02/19/13 1928 02/20/13 0435 02/21/13 0535 02/22/13 0405 02/23/13 0518  NA 155* 141 142 142 143 140  K 3.5 3.5 3.5 3.1* 3.5 4.0  CL 123* 111 111 103 102 99  CO2 19 17* 16* 26 26 24  GLUCOSE 142* 351* 337* 180* 129* 161*  BUN 96* 84* 86* 83* 75* 65*  CREATININE 4.81* 4.36* 4.66* 4.63* 4.78* 4.38*  CALCIUM 7.4* 6.9* 6.7* 6.5* 6.9* 7.0*  MG  --   --   --   --  1.5  --   PHOS 3.4  --  3.8 4.1 4.2 4.6   Liver Function Tests:  Recent Labs Lab 02/19/13 1928 02/20/13 0435 02/21/13 0535 02/22/13 0405 02/23/13 0518  AST 16  --   --   --   --   ALT 14  --   --   --   --   ALKPHOS 70  --   --   --   --   BILITOT 0.3  --   --   --   --   PROT 5.0*  --   --   --   --   ALBUMIN 1.4* 1.3* 1.3* 1.4* 1.4*   No results found for this basename: LIPASE, AMYLASE,  in the last 168 hours No results found for this basename: AMMONIA,  in the last 168  hours CBC:  Recent Labs Lab 02/17/13 0543 02/18/13 0915 02/19/13 1928 02/21/13 0535 02/23/13 0518  WBC 7.7 13.2* 16.7* 14.3* 11.5*  HGB 10.6* 11.5* 10.4* 10.6* 11.5*  HCT 33.3* 36.4* 32.7* 32.8* 35.2*  MCV 94.9 96.0 95.9 92.1 91.4  PLT 298 282 289 301 412*   Cardiac Enzymes:  Recent Labs Lab 02/19/13 2250 02/20/13 0143 02/20/13 0910  TROPONINI 0.38* 0.58* 0.70*   BNP (last 3 results)  Recent Labs  02/15/13 1908  PROBNP 987.2*   CBG:  Recent Labs Lab 02/22/13 1940 02/22/13 2321 02/23/13 0319 02/23/13 0808 02/23/13 1140  GLUCAP 147* 143* 152* 146* 151*    Recent Results (from the past 240 hour(s))  URINE CULTURE     Status: None   Collection Time    02/15/13  8:30 PM      Result Value Range Status   Specimen Description URINE, CATHETERIZED   Final   Special Requests NONE   Final   Culture  Setup Time     Final   Value: 02/16/2013 01:51     Performed at [facility] Count     Final   Value: NO GROWTH     Performed at [facility]   Culture     Final   Value: NO GROWTH     Performed at [facility]   Report Status 02/16/2013 FINAL   Final  CULTURE, BLOOD (ROUTINE X 2)     Status: None   Collection Time    02/15/13  8:55 PM      Result Value Range Status   Specimen Description BLOOD ARM RIGHT   Final   Special Requests BOTTLES DRAWN AEROBIC AND ANAEROBIC 10CC   Final   Culture  Setup Time     Final   Value: 02/16/2013 04:12     Performed at [facility]   Culture     Final   Value: NO GROWTH 5 DAYS     Performed at [facility]   Report Status 02/22/2013 FINAL   Final  CULTURE, BLOOD (ROUTINE X 2)     Status: None   Collection Time    02/15/13  9:05 PM      Result Value Range Status   Specimen Description BLOOD ARM LEFT   Final   Special Requests BOTTLES DRAWN AEROBIC ONLY 10CC   Final  Culture  Setup Time     Final   Value: 02/16/2013 04:12     Performed at [facility]   Culture      Final   Value: NO GROWTH 5 DAYS     Performed at [facility]   Report Status 02/22/2013 FINAL   Final  MRSA PCR SCREENING     Status: Abnormal   Collection Time    02/16/13  1:08 AM      Result Value Range Status   MRSA by PCR POSITIVE (*) NEGATIVE Final   Comment:            The GeneXpert MRSA Assay (FDA     approved for NASAL specimens     only), is one component of a     comprehensive MRSA colonization     surveillance program. It is not     intended to diagnose MRSA     infection nor to guide or     monitor treatment for     MRSA infections.     RESULT CALLED TO, READ BACK BY AND VERIFIED WITH:     HARAWAY,J,RN 0230 02/16/13 MITCHELL,L  CULTURE, BLOOD (ROUTINE X 2)     Status: None   Collection Time    02/19/13  8:50 PM      Result Value Range Status   Specimen Description BLOOD HAND RIGHT   Final   Special Requests BOTTLES DRAWN AEROBIC ONLY 2CC   Final   Culture  Setup Time     Final   Value: 02/20/2013 01:32     Performed at [facility]   Culture     Final   Value:        BLOOD CULTURE RECEIVED NO GROWTH TO DATE CULTURE WILL BE HELD FOR 5 DAYS BEFORE ISSUING A FINAL NEGATIVE REPORT     Performed at [facility]   Report Status PENDING   Incomplete  CULTURE, BLOOD (ROUTINE X 2)     Status: None   Collection Time    02/19/13 10:50 PM      Result Value Range Status   Specimen Description BLOOD RIGHT HAND   Final   Special Requests BOTTLES DRAWN AEROBIC ONLY 0.5CC   Final   Culture  Setup Time     Final   Value: 02/20/2013 12:50     Performed at [facility]   Culture     Final   Value:        BLOOD CULTURE RECEIVED NO GROWTH TO DATE CULTURE WILL BE HELD FOR 5 DAYS BEFORE ISSUING A FINAL NEGATIVE REPORT     Performed at [facility]   Report Status PENDING   Incomplete         Studies: No results found.      Scheduled Meds: . antiseptic oral rinse  15 mL Mouth Rinse BID  . aspirin  300 mg Rectal Daily  .  heparin  5,000 Units Subcutaneous Q8H  . insulin aspart  0-9 Units Subcutaneous Q4H  . insulin glargine  15 Units Subcutaneous QHS  . piperacillin-tazobactam (ZOSYN)  IV  2.25 g Intravenous Q8H  . sodium chloride  3 mL Intravenous Q12H   Continuous Infusions: . amiodarone (NEXTERONE PREMIX) 360 mg/200 mL dextrose 30 mg/hr (02/23/13 0900)  . dextrose 5 % with KCl 20 mEq / L 20 mEq (02/22/13 1308)    Principal Problem:   Renal failure, acute on chronic Active Problems:   Type II  or unspecified type diabetes mellitus with neurological manifestations, not stated as uncontrolled(250.60)   Essential hypertension, benign   Other dysphagia   CVA (cerebral vascular accident)   COPD (chronic obstructive pulmonary disease)   AKI (acute kidney injury)   CHF (congestive heart failure)   DNR (do not resuscitate)   Dehydration with hypernatremia   Dysphagia, unspecified(787.20)   Aspiration pneumonia   Acute respiratory failure with hypoxia   Atrial fibrillation with RVR    Time spent: 45 minutes    Prudie Guthridge, MD, FACP, FHM. [facility] Pager [phone]  If 7PM-7AM, please contact night-coverage [email or web address] Password [email or web address] 02/23/2013, 2:22 PM    LOS: 8 days

## 2013-02-23 NOTE — Progress Notes (Signed)
Patient only PIV is not functioning, no blood return, IV site leaking, surrounding skin cool and edematous, which is comparable to patient's other arm which does not have a PIV present.  Unclear if IV has infiltrated but it is clear that PIV is not functioning.  IV team notified.  IV team consulted yesterday and suggested central line be placed.  IV team called again today and second IV nurse is coming to assess for possible PIV placement.  Dr. Waymon Amato called and made aware.  He agreed with IV team to attempt another PIV, if not able, call son and consent for central line placement.  Dr. Ladona Ridgel (palliative) needs to be contacted if PIV not able to placed in regards to plan of care.  Will pass along information to next RN.  Keitha Butte, RN

## 2013-02-24 LAB — RENAL FUNCTION PANEL
Albumin: 1.4 g/dL — ABNORMAL LOW (ref 3.5–5.2)
BUN: 61 mg/dL — ABNORMAL HIGH (ref 6–23)
Calcium: 7.1 mg/dL — ABNORMAL LOW (ref 8.4–10.5)
GFR calc non Af Amer: 12 mL/min — ABNORMAL LOW (ref >90)
Glucose, Bld: 119 mg/dL — ABNORMAL HIGH (ref 70–99)
Phosphorus: 5.1 mg/dL — ABNORMAL HIGH (ref 2.3–4.6)
Potassium: 4 mEq/L (ref 3.7–5.3)
Sodium: 142 mEq/L (ref 137–147)

## 2013-02-24 LAB — GLUCOSE, CAPILLARY
Glucose-Capillary: 101 mg/dL — ABNORMAL HIGH (ref 70–99)
Glucose-Capillary: 111 mg/dL — ABNORMAL HIGH (ref 70–99)
Glucose-Capillary: 146 mg/dL — ABNORMAL HIGH (ref 70–99)
Glucose-Capillary: 129 mg/dL — ABNORMAL HIGH (ref 70–99)
Glucose-Capillary: 133 mg/dL — ABNORMAL HIGH (ref 70–99)

## 2013-02-24 NOTE — Progress Notes (Signed)
ANTIBIOTIC CONSULT NOTE - FOLLOW UP  Pharmacy Consult for Zosyn Indication: Empiric HCAP, r/o aspiration PNA coverage  No Known Allergies  Patient Measurements: Height: 5\' 5"  (165.1 cm) Weight: 145 lb 6.4 oz (65.953 kg) (bed scale) IBW/kg (Calculated) : 61.5  Vital Signs: Temp: 98.2 F (36.8 C) (12/31 0815) Temp src: Axillary (12/31 0815) BP: 114/54 mmHg (12/31 0407) Pulse Rate: 78 (12/31 0407) Intake/Output from previous day: 12/30 0701 - 12/31 0700 In: 2883.6 [I.V.:2383.6; IV Piggyback:500] Out: 1025 [Urine:1025] Intake/Output from this shift:    Labs:  Recent Labs  02/22/13 0405 02/23/13 0518 02/24/13 0410  WBC  --  11.5*  --   HGB  --  11.5*  --   PLT  --  412*  --   CREATININE 4.78* 4.38* 4.35*   Estimated Creatinine Clearance: 13.2 ml/min (by C-G formula based on Cr of 4.35).  Recent Labs  02/21/13 2058  [facility] 19.8     Microbiology: Recent Results (from the past 720 hour(s))  URINE CULTURE     Status: None   Collection Time    02/15/13  8:30 PM      Result Value Range Status   Specimen Description URINE, CATHETERIZED   Final   Special Requests NONE   Final   Culture  Setup Time     Final   Value: 02/16/2013 01:51     Performed at [facility] Count     Final   Value: NO GROWTH     Performed at [facility]   Culture     Final   Value: NO GROWTH     Performed at [facility]   Report Status 02/16/2013 FINAL   Final  CULTURE, BLOOD (ROUTINE X 2)     Status: None   Collection Time    02/15/13  8:55 PM      Result Value Range Status   Specimen Description BLOOD ARM RIGHT   Final   Special Requests BOTTLES DRAWN AEROBIC AND ANAEROBIC 10CC   Final   Culture  Setup Time     Final   Value: 02/16/2013 04:12     Performed at [facility]   Culture     Final   Value: NO GROWTH 5 DAYS     Performed at [facility]   Report Status 02/22/2013 FINAL   Final  CULTURE, BLOOD (ROUTINE X 2)      Status: None   Collection Time    02/15/13  9:05 PM      Result Value Range Status   Specimen Description BLOOD ARM LEFT   Final   Special Requests BOTTLES DRAWN AEROBIC ONLY 10CC   Final   Culture  Setup Time     Final   Value: 02/16/2013 04:12     Performed at [facility]   Culture     Final   Value: NO GROWTH 5 DAYS     Performed at [facility]   Report Status 02/22/2013 FINAL   Final  MRSA PCR SCREENING     Status: Abnormal   Collection Time    02/16/13  1:08 AM      Result Value Range Status   MRSA by PCR POSITIVE (*) NEGATIVE Final   Comment:            The GeneXpert MRSA Assay (FDA     approved for NASAL specimens     only), is one component of a  comprehensive MRSA colonization     surveillance program. It is not     intended to diagnose MRSA     infection nor to guide or     monitor treatment for     MRSA infections.     RESULT CALLED TO, READ BACK BY AND VERIFIED WITH:     HARAWAY,J,RN 0230 02/16/13 MITCHELL,L  CULTURE, BLOOD (ROUTINE X 2)     Status: None   Collection Time    02/19/13  8:50 PM      Result Value Range Status   Specimen Description BLOOD HAND RIGHT   Final   Special Requests BOTTLES DRAWN AEROBIC ONLY 2CC   Final   Culture  Setup Time     Final   Value: 02/20/2013 01:32     Performed at [facility]   Culture     Final   Value:        BLOOD CULTURE RECEIVED NO GROWTH TO DATE CULTURE WILL BE HELD FOR 5 DAYS BEFORE ISSUING A FINAL NEGATIVE REPORT     Performed at [facility]   Report Status PENDING   Incomplete  CULTURE, BLOOD (ROUTINE X 2)     Status: None   Collection Time    02/19/13 10:50 PM      Result Value Range Status   Specimen Description BLOOD RIGHT HAND   Final   Special Requests BOTTLES DRAWN AEROBIC ONLY 0.5CC   Final   Culture  Setup Time     Final   Value: 02/20/2013 12:50     Performed at [facility]   Culture     Final   Value:        BLOOD CULTURE RECEIVED NO GROWTH TO DATE  CULTURE WILL BE HELD FOR 5 DAYS BEFORE ISSUING A FINAL NEGATIVE REPORT     Performed at [facility]   Report Status PENDING   Incomplete    Anti-infectives   Start     Dose/Rate Route Frequency Ordered Stop   02/21/13 2230  vancomycin (VANCOCIN) IVPB 750 mg/150 ml premix     750 mg 150 mL/hr over 60 Minutes Intravenous  Once 02/21/13 2203 02/21/13 2343   02/19/13 2030  vancomycin (VANCOCIN) IVPB 1000 mg/200 mL premix     1,000 mg 200 mL/hr over 60 Minutes Intravenous NOW 02/19/13 2001 02/19/13 2244   02/19/13 2030  piperacillin-tazobactam (ZOSYN) IVPB 2.25 g     2.25 g 100 mL/hr over 30 Minutes Intravenous 3 times per day 02/19/13 2001     02/16/13 0000  cefTRIAXone (ROCEPHIN) 1 g in dextrose 5 % 50 mL IVPB  Status:  Discontinued     1 g 100 mL/hr over 30 Minutes Intravenous Every 24 hours 02/15/13 2320 02/19/13 1923   02/15/13 2045  vancomycin (VANCOCIN) IVPB 1000 mg/200 mL premix     1,000 mg 200 mL/hr over 60 Minutes Intravenous  Once 02/15/13 2040 02/15/13 2228   02/15/13 2045  piperacillin-tazobactam (ZOSYN) IVPB 3.375 g     3.375 g 12.5 mL/hr over 240 Minutes Intravenous  Once 02/15/13 2040 02/16/13 0114      Assessment: 72 y.o. M who continues on Zosyn D#6 for empiric HCAP and aspiration PNA coverage. The patient is afebrile, WBC 11.5, and continues to be in acute renal failure. Zosyn dose remains appropriate at this time.  Usually duration for aspiration PNA is 5-7 days and for HCAP is 7-8 days. Consider addressing LOT and adding stop date for either 1/1  or 1/2. Noted palliative meeting with family.   Rocephin 12/23 >> 12/26 Vanco 12/26 >> 12/29 * Vanc 1g on 12/26 >> VR 19.8 on 12/28 >> 750 mg x 1 Zosyn 12/26 >>  12/22 UCx >> NG 12/22 BCx >> NG 12/26 BCx >> ngtd  Goal of Therapy:  Proper antibiotics for infection/cultures adjusted for renal/hepatic function   Plan:  1. Continue Zosyn 2.25g IV every 8 hours 2. Please address intended LOT 3. Will  continue to follow renal function, culture results, LOT, and antibiotic de-escalation plans   Georgina Pillion, PharmD, BCPS Clinical Pharmacist Pager: [phone] 02/24/2013 8:32 AM

## 2013-02-24 NOTE — Progress Notes (Signed)
Patient Eric Graves      DOB: 25-Sep-1940      VWU:[id number]   Palliative Medicine Team at [facility] Progress Note    Subjective: Met with patient's two sons and daughter in law.  Patient remains with altered mental status, deep wet cough, tachypnea.  Family unable to comes to terms with is debilitated state which has been progressing over time.  Patient was admitted with dehydration and infection.  His course was complicated by aspiration pneumonia, afib with RvR. Casimiro Needle his POA has struggled with making comfort care choices.  Family feels like they would be short changing him if they make choices to stop therapies. They do not see the patient's previous existence as lacking quality.       Filed Vitals:   02/24/13 1200  BP:   Pulse:   Temp: 98.4 F (36.9 C)  Resp:    Physical exam:  General: mild distress, wet choking cough PERRL, EOM appear intact to best exam Chest coarse bilateral rhonchi, no wheezing CVS: regular, S1, S2 Abd: soft, not tender, not distended Ext: left greater than right arm swollen, twice normall size Neuro: altered. Will open eyes but not for long    Assessment and plan: 72 yr old white male with history of stroke ten years ago  With residual dysphagia and hemiplegia. Patient was admitted with infections, dehydration, and renal faiure. Course was complicated by aspiration inducing afib with RVr.    1.  DNR  2. Respiratory Failure: continue curative treatment  3.  Dysphagia: family entertaining PEG tube placement.  We have had multiple discussions regarding pros and cons.  Team will continue to work with family.    Total time:  6-7 pm  I have reviewed this case with our NP and agree with the Assessment and Plan as stated.  Ellianne Gowen L. Ladona Ridgel, MD MBA The Palliative Medicine Team at [facility] Phone: [phone] Pager: [phone]

## 2013-02-24 NOTE — Progress Notes (Signed)
[facility] PROGRESS NOTE    Eric Graves WUJ:[id number] DOB: December 10, 1940 DOA: 02/15/2013 PCP: Bufford Spikes, DO  HPI/Brief narrative 72 y.o. male with hx of HTN, CVA with left side hemiplegia, dysphagia requiring nectar thickened food, HTN, hx of COPD, brought to the ER as he was having increase shortness of breath. He does not communicate and denied he was having shortness of breath. Evalaution in the ER showed CXR with mild vascular congestion, AKI with Cr of 6.64 and BUN of 132. His baseline Cr was 1.7. He has a WBC of 13K, a Na of 155, and normal LFTs. He was admitted for hypernatremic dehydration, acute on chronic renal failure and UTI. He failed swallow evaluation initially. She was hydrated with hypotonic IV fluids and sodium normalized. Creatinine started improving. He was started on dysphagia diet and aspirated. He went into acute hypoxic respiratory failure which precipitated A. fib with RVR. He was transferred to step down unit. He remains on IV antibiotics, amiodarone, fluids, n.p.o. No significant change in his mental status, respiratory status and creatinines have plateaued. Palliative care team are on board and await further discussion with family and recommendations. Nephrology and CCM signed off. Overall poor prognosis.  Assessment/Plan: Goals of care - Dr. Waymon Amato discussed at length with son/health care power of attorney Eric Graves on 02/23/13 and advised him that patient is not making any significant improvement and in fact declining, has not had any form of nutrition for the last week and asked him if he and family had had any further discussions regarding further course for patient. He stated that he had none. Discussed with him about seemingly futile aggressive management at this time and to consider comfort oriented care. Palliative care at team to followup.  - appreciate palliative consult.  Aspiration pneumonia with sepsis/Dysphagia - On 12/26, following  initiation of dysphagia diet, patient seems to have aspirated and went into acute respiratory failure and rapid A. Fib. - Changed to n.p.o. Status, broadened the antibiotic spectrum Zosyn and vancomycin, pulmonary toilet - Respiratory status overall is tenuous. - blood cultures continued to be negative, continue with Zosyn alone. Acute respiratory failure - Apparently present on admission: He was placed on BiPAP on route to ED. - Most likely secondary to aspiration pneumonia and less likely due to pulmonary edema - Please see above - After going back and forth with family, patient currently is DO NOT RESUSCITATE/DO NOT INTUBATE - No improvement in respiratory status. Remains on oxygen via facemask. - Aggressive pulmonary toilet A. Fib with RVR - Started 12/26 PM. Likely precipitated by acute respiratory events. - He did not respond to IV metoprolol. Transferred to the step down unit and placed on Cardizem drip. He was weaned off Cardizem drip briefly but then went back into rapid A. fib on 12/27 AM. CCM started amiodarone. He has reverted to sinus rhythm on 12/28 at 2:40 a.m.  - 2-D echo: EF 55-60%. Wall motion was normal; there were no regional wall motion abnormalities.  - Mildly elevated troponin likely secondary to stress of RVR and aspiration pneumonia. He will not be a candidate for aggressive ischemic evaluation. - Remains in sinus rhythm on amiodarone drip - Likely secondary to A. Fib with RVR and sepsis from aspiration pneumonia - resolved Anion gap metabolic acidosis - Likely secondary to renal failure. Lactate and serum acetones normal. Less likely due to DKA. - Resolved after bicarbonate drip. DCed bicarbonate Acute on chronic renal failure-Principal admitting diagnosis - Likely secondary to volume depletion -  Baseline creatinine in H&P was recorded as 1.7 but unsure as to when and where it was obtained from. - Nephrology consultation & followup appreciated. - Patient was  oliguric all day yesterday:? Element of ATN. He did make 500 mL of urine last night. - Continue gentle IV fluids. Not candidate for hemodialysis. - Creatinine seems to have plateaued. Nephrology signed off. Hypokalemia - Repleted Hypernatremic dehydration - Hypernatremia resolved. Resolved Anemia - Possibly chronic.  stable  Dysphagia - Aspirated on dysphagia diet. N.p.o. Discussed at length with 2 sons on 12/26 PM. Henceforth, he probably will not be a good candidate for oral intake given high risk for recurrent aspiration. Advised them that PEG tube will also carry some risk of aspiration. Palliative care team to followup with patient and family. Type II DM - Uncontrolled. However due to n.p.o. status, change SSI to every 4 hours.Continue Lantus. Reasonably controlled. Failure to thrive - Palliative care team input and followup appreciated - Overall poor long-term prognosis Hypertension: -Currently soft blood pressures. Carvedilol on hold  Code Status: DO NOT RESUSCITATE  Family Communication: none today Disposition Plan: To be determined. Remains in step down unit.    Consultants:  Palliative care team   Nephrology  Critical care medicine  Procedures:  None   Antibiotics:  IV Rocephin DC'd  IV vancomycin 12/26 >   IV Zosyn 12/26 > 12/28  Subjective: Aphasic/nonverbal. Per nursing, not very responsive for the last 2 days.  Objective: Filed Vitals:   02/24/13 0815 02/24/13 1146 02/24/13 1147 02/24/13 1200  BP:  150/86    Pulse:  72 74   Temp: 98.2 F (36.8 C)   98.4 F (36.9 C)  TempSrc: Axillary   Axillary  Resp:  21 19   Height:      Weight:      SpO2:  100% 100%     Intake/Output Summary (Last 24 hours) at 02/24/13 1354 Last data filed at 02/24/13 1100  Gross per 24 hour  Intake 2766.9 ml  Output   1375 ml  Net 1391.9 ml   Filed Weights   02/17/13 0348 02/18/13 0510 02/19/13 0440  Weight: 66.9 kg (147 lb 7.8 oz) 66.679 kg (147 lb) 65.953 kg  (145 lb 6.4 oz)     Exam:  General exam: Chronically ill looking elderly male lying propped up in bed in no obvious distress Respiratory system:   Reduced BS's B/L and scattered crackle posteriorly. No wheezing or rhonchi. No increased work of breathing. Cardiovascular system: S1 & S2 heard, RRR. No JVD, murmurs, gallops, clicks or pedal edema. telemetry: SR Gastrointestinal system: Abdomen is nondistended, soft and nontender. Normal bowel sounds heard. Central nervous system: Somnolent but easily arousable, nonverbal and looks around and drifts back to sleep. No new focal neurological deficits. Extremities: Symmetric 4x 5 power.  Data Reviewed: Basic Metabolic Panel:  Recent Labs Lab 02/20/13 0435 02/21/13 0535 02/22/13 0405 02/23/13 0518 02/24/13 0410  NA 142 142 143 140 142  K 3.5 3.1* 3.5 4.0 4.0  CL 111 103 102 99 101  CO2 16* 26 26 24 26   GLUCOSE 337* 180* 129* 161* 119*  BUN 86* 83* 75* 65* 61*  CREATININE 4.66* 4.63* 4.78* 4.38* 4.35*  CALCIUM 6.7* 6.5* 6.9* 7.0* 7.1*  MG  --   --  1.5  --   --   PHOS 3.8 4.1 4.2 4.6 5.1*   Liver Function Tests:  Recent Labs Lab 02/19/13 1928 02/20/13 0435 02/21/13 0535 02/22/13 0405 02/23/13 0518 02/24/13 0410  AST 16  --   --   --   --   --   ALT 14  --   --   --   --   --   ALKPHOS 70  --   --   --   --   --   BILITOT 0.3  --   --   --   --   --   PROT 5.0*  --   --   --   --   --   ALBUMIN 1.4* 1.3* 1.3* 1.4* 1.4* 1.4*   CBC:  Recent Labs Lab 02/18/13 0915 02/19/13 1928 02/21/13 0535 02/23/13 0518  WBC 13.2* 16.7* 14.3* 11.5*  HGB 11.5* 10.4* 10.6* 11.5*  HCT 36.4* 32.7* 32.8* 35.2*  MCV 96.0 95.9 92.1 91.4  PLT 282 289 301 412*   Cardiac Enzymes:  Recent Labs Lab 02/19/13 2250 02/20/13 0143 02/20/13 0910  TROPONINI 0.38* 0.58* 0.70*   BNP (last 3 results)  Recent Labs  02/15/13 1908  PROBNP 987.2*   CBG:  Recent Labs Lab 02/23/13 1954 02/23/13 2346 02/24/13 0406 02/24/13 0811  02/24/13 1149  GLUCAP 102* 101* 111* 117* 146*    Recent Results (from the past 240 hour(s))  URINE CULTURE     Status: None   Collection Time    02/15/13  8:30 PM      Result Value Range Status   Specimen Description URINE, CATHETERIZED   Final   Special Requests NONE   Final   Culture  Setup Time     Final   Value: 02/16/2013 01:51     Performed at [facility] Count     Final   Value: NO GROWTH     Performed at [facility]   Culture     Final   Value: NO GROWTH     Performed at [facility]   Report Status 02/16/2013 FINAL   Final  CULTURE, BLOOD (ROUTINE X 2)     Status: None   Collection Time    02/15/13  8:55 PM      Result Value Range Status   Specimen Description BLOOD ARM RIGHT   Final   Special Requests BOTTLES DRAWN AEROBIC AND ANAEROBIC 10CC   Final   Culture  Setup Time     Final   Value: 02/16/2013 04:12     Performed at [facility]   Culture     Final   Value: NO GROWTH 5 DAYS     Performed at [facility]   Report Status 02/22/2013 FINAL   Final  CULTURE, BLOOD (ROUTINE X 2)     Status: None   Collection Time    02/15/13  9:05 PM      Result Value Range Status   Specimen Description BLOOD ARM LEFT   Final   Special Requests BOTTLES DRAWN AEROBIC ONLY 10CC   Final   Culture  Setup Time     Final   Value: 02/16/2013 04:12     Performed at [facility]   Culture     Final   Value: NO GROWTH 5 DAYS     Performed at [facility]   Report Status 02/22/2013 FINAL   Final  MRSA PCR SCREENING     Status: Abnormal   Collection Time    02/16/13  1:08 AM      Result Value Range Status   MRSA by PCR POSITIVE (*) NEGATIVE Final  Comment:            The GeneXpert MRSA Assay (FDA     approved for NASAL specimens     only), is one component of a     comprehensive MRSA colonization     surveillance program. It is not     intended to diagnose MRSA     infection nor to guide or     monitor  treatment for     MRSA infections.     RESULT CALLED TO, READ BACK BY AND VERIFIED WITH:     HARAWAY,J,RN 0230 02/16/13 MITCHELL,L  CULTURE, BLOOD (ROUTINE X 2)     Status: None   Collection Time    02/19/13  8:50 PM      Result Value Range Status   Specimen Description BLOOD HAND RIGHT   Final   Special Requests BOTTLES DRAWN AEROBIC ONLY 2CC   Final   Culture  Setup Time     Final   Value: 02/20/2013 01:32     Performed at [facility]   Culture     Final   Value:        BLOOD CULTURE RECEIVED NO GROWTH TO DATE CULTURE WILL BE HELD FOR 5 DAYS BEFORE ISSUING A FINAL NEGATIVE REPORT     Performed at [facility]   Report Status PENDING   Incomplete  CULTURE, BLOOD (ROUTINE X 2)     Status: None   Collection Time    02/19/13 10:50 PM      Result Value Range Status   Specimen Description BLOOD RIGHT HAND   Final   Special Requests BOTTLES DRAWN AEROBIC ONLY 0.5CC   Final   Culture  Setup Time     Final   Value: 02/20/2013 12:50     Performed at [facility]   Culture     Final   Value:        BLOOD CULTURE RECEIVED NO GROWTH TO DATE CULTURE WILL BE HELD FOR 5 DAYS BEFORE ISSUING A FINAL NEGATIVE REPORT     Performed at [facility]   Report Status PENDING   Incomplete      Scheduled Meds: . antiseptic oral rinse  15 mL Mouth Rinse BID  . aspirin  300 mg Rectal Daily  . heparin  5,000 Units Subcutaneous Q8H  . insulin aspart  0-9 Units Subcutaneous Q4H  . insulin glargine  15 Units Subcutaneous QHS  . piperacillin-tazobactam (ZOSYN)  IV  2.25 g Intravenous Q8H  . sodium chloride  3 mL Intravenous Q12H   Continuous Infusions: . amiodarone (NEXTERONE PREMIX) 360 mg/200 mL dextrose 30 mg/hr (02/24/13 1100)  . dextrose 5 % with KCl 20 mEq / L 20 mEq (02/23/13 2147)   Principal Problem:   Renal failure, acute on chronic Active Problems:   Type II or unspecified type diabetes mellitus with neurological manifestations, not stated as  uncontrolled(250.60)   Essential hypertension, benign   Other dysphagia   CVA (cerebral vascular accident)   COPD (chronic obstructive pulmonary disease)   AKI (acute kidney injury)   CHF (congestive heart failure)   DNR (do not resuscitate)   Dehydration with hypernatremia   Dysphagia, unspecified(787.20)   Aspiration pneumonia   Acute respiratory failure with hypoxia   Atrial fibrillation with RVR  Time spent: 35 minutes  Costin M. Elvera Lennox, MD [facility] [phone]  If 7PM-7AM, please contact night-coverage [email or web address] Password [email or web address] 02/24/2013, 1:54 PM    LOS: 9 days

## 2013-02-25 LAB — BASIC METABOLIC PANEL
BUN: 54 mg/dL — ABNORMAL HIGH (ref 6–23)
CO2: 24 mEq/L (ref 19–32)
Calcium: 7.5 mg/dL — ABNORMAL LOW (ref 8.4–10.5)
Chloride: 99 mEq/L (ref 96–112)
Creatinine, Ser: 4.1 mg/dL — ABNORMAL HIGH (ref 0.50–1.35)
GFR calc Af Amer: 15 mL/min — ABNORMAL LOW (ref >90)
GFR calc non Af Amer: 13 mL/min — ABNORMAL LOW (ref >90)
Glucose, Bld: 107 mg/dL — ABNORMAL HIGH (ref 70–99)
Potassium: 4.6 mEq/L (ref 3.7–5.3)
Sodium: 139 mEq/L (ref 137–147)

## 2013-02-25 LAB — CBC
HCT: 38.3 % — ABNORMAL LOW (ref 39.0–52.0)
Hemoglobin: 12.4 g/dL — ABNORMAL LOW (ref 13.0–17.0)
MCH: 29.7 pg (ref 26.0–34.0)
MCHC: 32.4 g/dL (ref 30.0–36.0)
MCV: 91.8 fL (ref 78.0–100.0)
Platelets: 486 10*3/uL — ABNORMAL HIGH (ref 150–400)
RBC: 4.17 MIL/uL — ABNORMAL LOW (ref 4.22–5.81)
RDW: 14.2 % (ref 11.5–15.5)
WBC: 10.1 10*3/uL (ref 4.0–10.5)

## 2013-02-25 LAB — GLUCOSE, CAPILLARY
GLUCOSE-CAPILLARY: 107 mg/dL — AB (ref 70–99)
GLUCOSE-CAPILLARY: 107 mg/dL — AB (ref 70–99)
Glucose-Capillary: 111 mg/dL — ABNORMAL HIGH (ref 70–99)
Glucose-Capillary: 98 mg/dL (ref 70–99)
Glucose-Capillary: 79 mg/dL (ref 70–99)
Glucose-Capillary: 78 mg/dL (ref 70–99)

## 2013-02-25 MED ORDER — METOPROLOL TARTRATE 1 MG/ML IV SOLN
2.5000 mg | Freq: Four times a day (QID) | INTRAVENOUS | Status: DC
  Administered 2013-02-26 – 2013-03-01 (×15): 2.5 mg via INTRAVENOUS
  Filled 2013-02-25 (×18): qty 5

## 2013-02-25 NOTE — Progress Notes (Signed)
Progress Note from the Palliative Medicine Team at [facility]  Subjective: Discussion was had with son Eric Graves about his father's declining health status while here in the hospital. Eric Graves is in stepdown requiring venti mask oxygen and amiodarone for heart rate control. We explained that he was not progressing the way he had hoped and is unable to eat. We explained that there are limited interventions available to help his father improve and that even with all these interventions Eric Graves is still failing to thrive. His kidneys have improved slightly from creatinine 6 to 4 and GFR of 13 but still not functioning. The son is verbalizing understanding but will not make any decisions towards medical de-escalation or towards comfort measures. Eric Graves was provided a copy of [employer] [facility] previously but has not utilized this to help with decision making. He wishes to "maintain status quo" realizing that his father is not eating and "if he starves to death then he starves to death." We attempted to set up a meeting to further discuss goals of care to help with decision making but he declined. We will continue to follow and help Eric Graves and his family during this difficult time.     Objective: No Known Allergies Scheduled Meds: . antiseptic oral rinse  15 mL Mouth Rinse BID  . aspirin  300 mg Rectal Daily  . heparin  5,000 Units Subcutaneous Q8H  . insulin aspart  0-9 Units Subcutaneous Q4H  . insulin glargine  15 Units Subcutaneous QHS  . piperacillin-tazobactam (ZOSYN)  IV  2.25 g Intravenous Q8H  . sodium chloride  3 mL Intravenous Q12H   Continuous Infusions: . amiodarone (NEXTERONE PREMIX) 360 mg/200 mL dextrose 30 mg/hr (02/24/13 1800)  . dextrose 5 % with KCl 20 mEq / L 20 mEq (02/23/13 2147)   PRN Meds:.acetaminophen, hydrALAZINE, levalbuterol, ondansetron (ZOFRAN) IV, ondansetron  BP 162/65  Pulse 70  Temp(Src) 97.5 F (36.4 C) (Oral)  Resp 22  Ht 5\' 5"  (1.651 m)  Wt 65.953  kg (145 lb 6.4 oz)  BMI 24.20 kg/m2  SpO2 100%   PPS: 20% at best  Pain Score: nonverbal   Intake/Output Summary (Last 24 hours) at 02/25/13 1052 Last data filed at 02/25/13 1000  Gross per 24 hour  Intake 2800.8 ml  Output   1627 ml  Net 1173.8 ml      LBM: 02/25/13      Physical Exam:  General: NAD, ill appearing, pale HEENT: Eric Graves Chest: Scattered crackles, decreased bases CVS: RRR, S1 S2 Abdomen: Soft, NT, NT, obese, +BS-decreased Ext: Bilat foot drop/contracted, bilat feet mottled Neuro: Nonverbal, confused, +tracking, unable to follow commands  Labs: CBC    Component Value Date/Time   WBC 10.1 02/25/2013 0450   RBC 4.17* 02/25/2013 0450   HGB 12.4* 02/25/2013 0450   HCT 38.3* 02/25/2013 0450   PLT 486* 02/25/2013 0450   MCV 91.8 02/25/2013 0450   MCH 29.7 02/25/2013 0450   MCHC 32.4 02/25/2013 0450   RDW 14.2 02/25/2013 0450   LYMPHSABS 1.8 02/15/2013 1908   MONOABS 1.6* 02/15/2013 1908   EOSABS 0.0 02/15/2013 1908   BASOSABS 0.0 02/15/2013 1908    BMET    Component Value Date/Time   NA 139 02/25/2013 0450   K 4.6 02/25/2013 0450   CL 99 02/25/2013 0450   CO2 24 02/25/2013 0450   GLUCOSE 107* 02/25/2013 0450   BUN 54* 02/25/2013 0450   CREATININE 4.10* 02/25/2013 0450   CALCIUM 7.5* 02/25/2013 0450  GFRNONAA 13* 02/25/2013 0450   GFRAA 15* 02/25/2013 0450    CMP     Component Value Date/Time   NA 139 02/25/2013 0450   K 4.6 02/25/2013 0450   CL 99 02/25/2013 0450   CO2 24 02/25/2013 0450   GLUCOSE 107* 02/25/2013 0450   BUN 54* 02/25/2013 0450   CREATININE 4.10* 02/25/2013 0450   CALCIUM 7.5* 02/25/2013 0450   PROT 5.0* 02/19/2013 1928   ALBUMIN 1.4* 02/24/2013 0410   AST 16 02/19/2013 1928   ALT 14 02/19/2013 1928   ALKPHOS 70 02/19/2013 1928   BILITOT 0.3 02/19/2013 1928   GFRNONAA 13* 02/25/2013 0450   GFRAA 15* 02/25/2013 0450     Assessment and Plan: 1. Code Status: DNR 2. Symptom Control: 1. Weakness: Continue medical treatment. PROM. 2. Nausea: Ondansetron  prn. 3. Psycho/Social: Emotional support provided to patient and son Eric Graves. 4. Disposition: To be determined on outcomes.    Time In Time Out Total Time Spent with Patient Total Overall Time  1035 1100 15min 25min    Greater than 50%  of this time was spent counseling and coordinating care related to the above assessment and plan.  Yong ChannelAlicia Franchot Pollitt, NP Palliative Medicine Team Team Phone # [phone]   1

## 2013-02-25 NOTE — Progress Notes (Signed)
[facility] PROGRESS NOTE    Emit Kuenzel ZOX:[id number] DOB: Jul 27, 1940 DOA: 02/15/2013 PCP: Bufford Spikes, DO  HPI/Brief narrative 73 y.o. male with hx of HTN, CVA with left side hemiplegia, dysphagia requiring nectar thickened food, HTN, hx of COPD, brought to the ER as he was having increase shortness of breath. He does not communicate and denied he was having shortness of breath. Evalaution in the ER showed CXR with mild vascular congestion, AKI with Cr of 6.64 and BUN of 132. His baseline Cr was 1.7. He has a WBC of 13K, a Na of 155, and normal LFTs. He was admitted for hypernatremic dehydration, acute on chronic renal failure and UTI. He failed swallow evaluation initially. She was hydrated with hypotonic IV fluids and sodium normalized. Creatinine started improving. He was started on dysphagia diet and aspirated. He went into acute hypoxic respiratory failure which precipitated A. fib with RVR. He was transferred to step down unit. He remains on IV antibiotics, amiodarone, fluids, n.p.o. No significant change in his mental status, respiratory status and creatinines have plateaued. Palliative care team are on board and await further discussion with family and recommendations. Nephrology and CCM signed off. Overall poor prognosis.  Assessment/Plan: Goals of care - Dr. Waymon Amato discussed at length with son/health care power of attorney Mr. Dylann Gallier on 02/23/13 and advised him that patient is not making any significant improvement and in fact declining, has not had any form of nutrition for the last week and asked him if he and family had had any further discussions regarding further course for patient. He stated that he had none. Discussed with him about seemingly futile aggressive management at this time and to consider comfort oriented care. Palliative care at team to followup.  - appreciate palliative consult.  - difficult overall situation.  A. Fib with RVR - Started 12/26 PM.  Likely precipitated by acute respiratory events. - He did not respond to IV metoprolol. Transferred to the step down unit and placed on Cardizem drip. He was weaned off Cardizem drip briefly but then went back into rapid A. fib on 12/27 AM. CCM started amiodarone. He has reverted to sinus rhythm on 12/28 at 2:40 a.m.  - 2-D echo: EF 55-60%. Wall motion was normal; there were no regional wall motion abnormalities.  - Mildly elevated troponin likely secondary to stress of RVR and aspiration pneumonia. He will not be a candidate for aggressive ischemic evaluation. - Remains in sinus rhythm on amiodarone drip - Likely secondary to A. Fib with RVR and sepsis from aspiration pneumonia - resolved, will likely transition to scheduled iv metoprolol and discontinue Amiodarone.  Aspiration pneumonia with sepsis/Dysphagia - On 12/26, following initiation of dysphagia diet, patient seems to have aspirated and went into acute respiratory failure and rapid A. Fib. - Changed to n.p.o. Status, broadened the antibiotic spectrum Zosyn and vancomycin, pulmonary toilet - Respiratory status overall is tenuous. - blood cultures continued to be negative, continue with Zosyn alone. Acute respiratory failure - Apparently present on admission: He was placed on BiPAP on route to ED. - Most likely secondary to aspiration pneumonia and less likely due to pulmonary edema - Please see above - After going back and forth with family, patient currently is DO NOT RESUSCITATE/DO NOT INTUBATE - No improvement in respiratory status. Remains on oxygen via facemask. - Aggressive pulmonary toilet Anion gap metabolic acidosis - Likely secondary to renal failure. Lactate and serum acetones normal. Less likely due to DKA. - Resolved after bicarbonate drip.  DCed bicarbonate Acute on chronic renal failure-Principal admitting diagnosis - Likely secondary to volume depletion - Baseline creatinine in H&P was recorded as 1.7 but unsure as to  when and where it was obtained from. - Nephrology consultation & followup appreciated. - Patient was oliguric all day yesterday:? Element of ATN. He did make 500 mL of urine last night. - Continue gentle IV fluids. Not candidate for hemodialysis. - Creatinine seems to have plateaued. Nephrology signed off. Hypokalemia - Repleted Hypernatremic dehydration - Hypernatremia resolved. Resolved Anemia - Possibly chronic.  stable  Dysphagia - Aspirated on dysphagia diet. N.p.o. Discussed at length with 2 sons on 12/26 PM. Henceforth, he probably will not be a good candidate for oral intake given high risk for recurrent aspiration. Advised them that PEG tube will also carry some risk of aspiration. Palliative care team to followup with patient and family. Type II DM - Uncontrolled. However due to n.p.o. status, change SSI to every 4 hours.Continue Lantus. Reasonably controlled. Failure to thrive - Palliative care team input and followup appreciated - Overall poor long-term prognosis Hypertension: - start iv metop scheduled.   Code Status: DO NOT RESUSCITATE  Family Communication: none today Disposition Plan: To be determined. Remains in step down unit.   Consultants:  Palliative care team   Nephrology  Critical care medicine  Procedures:  None   Antibiotics:  IV Rocephin DC'd  IV vancomycin 12/26 >   IV Zosyn 12/26 > 12/28  Subjective: Aphasic/nonverbal. Per nursing, not very responsive for the last 2 days.  Objective: Filed Vitals:   02/25/13 0200 02/25/13 0340 02/25/13 0800 02/25/13 1200  BP: 154/75 153/65 162/65 163/69  Pulse: 76 70    Temp: 97.6 F (36.4 C) 98.3 F (36.8 C) 97.5 F (36.4 C) 96.2 F (35.7 C)  TempSrc: Oral Oral Oral Oral  Resp: 27 22    Height:      Weight:      SpO2: 99% 100%      Intake/Output Summary (Last 24 hours) at 02/25/13 1348 Last data filed at 02/25/13 1200  Gross per 24 hour  Intake 2684.1 ml  Output   1827 ml  Net  857.1  ml   Filed Weights   02/17/13 0348 02/18/13 0510 02/19/13 0440  Weight: 66.9 kg (147 lb 7.8 oz) 66.679 kg (147 lb) 65.953 kg (145 lb 6.4 oz)     Exam:  General exam: Chronically ill looking elderly male lying propped up in bed in no obvious distress Respiratory system:   Reduced BS's B/L and scattered crackle posteriorly. No wheezing or rhonchi. No increased work of breathing. Cardiovascular system: S1 & S2 heard, RRR. No JVD, murmurs, gallops, clicks or pedal edema. telemetry: SR Gastrointestinal system: Abdomen is nondistended, soft and nontender. Normal bowel sounds heard. Central nervous system: Somnolent but easily arousable, nonverbal and looks around and drifts back to sleep. No new focal neurological deficits. Extremities: Symmetric 4x 5 power.  Data Reviewed: Basic Metabolic Panel:  Recent Labs Lab 02/20/13 0435 02/21/13 0535 02/22/13 0405 02/23/13 0518 02/24/13 0410 02/25/13 0450  NA 142 142 143 140 142 139  K 3.5 3.1* 3.5 4.0 4.0 4.6  CL 111 103 102 99 101 99  CO2 16* 26 26 24 26 24   GLUCOSE 337* 180* 129* 161* 119* 107*  BUN 86* 83* 75* 65* 61* 54*  CREATININE 4.66* 4.63* 4.78* 4.38* 4.35* 4.10*  CALCIUM 6.7* 6.5* 6.9* 7.0* 7.1* 7.5*  MG  --   --  1.5  --   --   --  PHOS 3.8 4.1 4.2 4.6 5.1*  --    Liver Function Tests:  Recent Labs Lab 02/19/13 1928 02/20/13 0435 02/21/13 0535 02/22/13 0405 02/23/13 0518 02/24/13 0410  AST 16  --   --   --   --   --   ALT 14  --   --   --   --   --   ALKPHOS 70  --   --   --   --   --   BILITOT 0.3  --   --   --   --   --   PROT 5.0*  --   --   --   --   --   ALBUMIN 1.4* 1.3* 1.3* 1.4* 1.4* 1.4*   CBC:  Recent Labs Lab 02/19/13 1928 02/21/13 0535 02/23/13 0518 02/25/13 0450  WBC 16.7* 14.3* 11.5* 10.1  HGB 10.4* 10.6* 11.5* 12.4*  HCT 32.7* 32.8* 35.2* 38.3*  MCV 95.9 92.1 91.4 91.8  PLT 289 301 412* 486*   Cardiac Enzymes:  Recent Labs Lab 02/19/13 2250 02/20/13 0143 02/20/13 0910  TROPONINI  0.38* 0.58* 0.70*   BNP (last 3 results)  Recent Labs  02/15/13 1908  PROBNP 987.2*   CBG:  Recent Labs Lab 02/24/13 2028 02/24/13 2334 02/25/13 0339 02/25/13 0855 02/25/13 1139  GLUCAP 133* 98 111* 79 78    Recent Results (from the past 240 hour(s))  URINE CULTURE     Status: None   Collection Time    02/15/13  8:30 PM      Result Value Range Status   Specimen Description URINE, CATHETERIZED   Final   Special Requests NONE   Final   Culture  Setup Time     Final   Value: 02/16/2013 01:51     Performed at [facility]   Colony Count     Final   Value: NO GROWTH     [facility]formed at [facility]   Culture     Final   Value: NO GROWTH     Performed at [facility]   Report Status 02/16/2013 FINAL   Final  CULTURE, BLOOD (ROUTINE X 2)     Status: None   Collection Time    02/15/13  8:55 PM      Result Value Range Status   Specimen Description BLOOD ARM RIGHT   Final   Special Requests BOTTLES DRAWN AEROBIC AND ANAEROBIC 10CC   Final   Culture  Setup Time     Final   Value: 02/16/2013 04:12     Performed at [facility]   Culture     Final   Value: NO GROWTH 5 DAYS     Performed at [facility]   Report Status 02/22/2013 FINAL   Final  CULTURE, BLOOD (ROUTINE X 2)     Status: None   Collection Time    02/15/13  9:05 PM      Result Value Range Status   Specimen Description BLOOD ARM LEFT   Final   Special Requests BOTTLES DRAWN AEROBIC ONLY 10CC   Final   Culture  Setup Time     Final   Value: 02/16/2013 04:12     Performed at [facility]   Culture     Final   Value: NO GROWTH 5 DAYS     Performed at [facility]   Report Status 02/22/2013 FINAL   Final  MRSA PCR SCREENING  Status: Abnormal   Collection Time    02/16/13  1:08 AM      Result Value Range Status   MRSA by PCR POSITIVE (*) NEGATIVE Final   Comment:            The GeneXpert MRSA Assay (FDA     approved for NASAL specimens     only),  is one component of a     comprehensive MRSA colonization     surveillance program. It is not     intended to diagnose MRSA     infection nor to guide or     monitor treatment for     MRSA infections.     RESULT CALLED TO, READ BACK BY AND VERIFIED WITH:     HARAWAY,J,RN 0230 02/16/13 MITCHELL,L  CULTURE, BLOOD (ROUTINE X 2)     Status: None   Collection Time    02/19/13  8:50 PM      Result Value Range Status   Specimen Description BLOOD HAND RIGHT   Final   Special Requests BOTTLES DRAWN AEROBIC ONLY 2CC   Final   Culture  Setup Time     Final   Value: 02/20/2013 01:32     Performed at [facility]   Culture     Final   Value:        BLOOD CULTURE RECEIVED NO GROWTH TO DATE CULTURE WILL BE HELD FOR 5 DAYS BEFORE ISSUING A FINAL NEGATIVE REPORT     Performed at [facility]   Report Status PENDING   Incomplete  CULTURE, BLOOD (ROUTINE X 2)     Status: None   Collection Time    02/19/13 10:50 PM      Result Value Range Status   Specimen Description BLOOD RIGHT HAND   Final   Special Requests BOTTLES DRAWN AEROBIC ONLY 0.5CC   Final   Culture  Setup Time     Final   Value: 02/20/2013 12:50     Performed at [facility]   Culture     Final   Value:        BLOOD CULTURE RECEIVED NO GROWTH TO DATE CULTURE WILL BE HELD FOR 5 DAYS BEFORE ISSUING A FINAL NEGATIVE REPORT     Performed at [facility]   Report Status PENDING   Incomplete      Scheduled Meds: . antiseptic oral rinse  15 mL Mouth Rinse BID  . aspirin  300 mg Rectal Daily  . heparin  5,000 Units Subcutaneous Q8H  . insulin aspart  0-9 Units Subcutaneous Q4H  . insulin glargine  15 Units Subcutaneous QHS  . piperacillin-tazobactam (ZOSYN)  IV  2.25 g Intravenous Q8H  . sodium chloride  3 mL Intravenous Q12H   Continuous Infusions: . amiodarone (NEXTERONE PREMIX) 360 mg/200 mL dextrose 30 mg/hr (02/25/13 1347)  . dextrose 5 % with KCl 20 mEq / L 20 mEq (02/23/13 2147)   Principal  Problem:   Renal failure, acute on chronic Active Problems:   Type II or unspecified type diabetes mellitus with neurological manifestations, not stated as uncontrolled(250.60)   Essential hypertension, benign   Other dysphagia   CVA (cerebral vascular accident)   COPD (chronic obstructive pulmonary disease)   AKI (acute kidney injury)   CHF (congestive heart failure)   DNR (do not resuscitate)   Dehydration with hypernatremia   Dysphagia, unspecified(787.20)   Aspiration pneumonia   Acute respiratory failure with hypoxia   Atrial fibrillation with  RVR  Time spent: 25 minutes  Costin M. Elvera Lennox, MD [facility] [phone]  If 7PM-7AM, please contact night-coverage [email or web address] Password [email or web address] 02/25/2013, 1:48 PM    LOS: 10 days

## 2013-02-26 LAB — BASIC METABOLIC PANEL
BUN: 47 mg/dL — ABNORMAL HIGH (ref 6–23)
CALCIUM: 7 mg/dL — AB (ref 8.4–10.5)
CO2: 23 mEq/L (ref 19–32)
Chloride: 99 mEq/L (ref 96–112)
Creatinine, Ser: 3.75 mg/dL — ABNORMAL HIGH (ref 0.50–1.35)
GFR calc Af Amer: 17 mL/min — ABNORMAL LOW (ref >90)
GFR, EST NON AFRICAN AMERICAN: 15 mL/min — AB (ref >90)
GLUCOSE: 206 mg/dL — AB (ref 70–99)
Potassium: 4.8 mEq/L (ref 3.7–5.3)
SODIUM: 136 meq/L — AB (ref 137–147)

## 2013-02-26 LAB — CULTURE, BLOOD (ROUTINE X 2)
Culture: NO GROWTH
Culture: NO GROWTH

## 2013-02-26 LAB — GLUCOSE, CAPILLARY
Glucose-Capillary: 116 mg/dL — ABNORMAL HIGH (ref 70–99)
Glucose-Capillary: 118 mg/dL — ABNORMAL HIGH (ref 70–99)
Glucose-Capillary: 120 mg/dL — ABNORMAL HIGH (ref 70–99)
Glucose-Capillary: 128 mg/dL — ABNORMAL HIGH (ref 70–99)
Glucose-Capillary: 150 mg/dL — ABNORMAL HIGH (ref 70–99)
Glucose-Capillary: 85 mg/dL (ref 70–99)

## 2013-02-26 MED ORDER — ATROPINE SULFATE 1 % OP SOLN
2.0000 [drp] | OPHTHALMIC | Status: DC | PRN
  Administered 2013-02-26 – 2013-02-27 (×4): 2 [drp] via SUBLINGUAL
  Filled 2013-02-26: qty 2

## 2013-02-26 MED ORDER — ATROPINE SULFATE 1 % OP SOLN
2.0000 [drp] | OPHTHALMIC | Status: DC | PRN
  Filled 2013-02-26: qty 2

## 2013-02-26 MED ORDER — INSULIN GLARGINE 100 UNIT/ML ~~LOC~~ SOLN
5.0000 [IU] | Freq: Every day | SUBCUTANEOUS | Status: DC
  Administered 2013-02-26: 5 [IU] via SUBCUTANEOUS
  Filled 2013-02-26 (×2): qty 0.05

## 2013-02-26 MED ORDER — POTASSIUM CL IN DEXTROSE 5% 20 MEQ/L IV SOLN
20.0000 meq | INTRAVENOUS | Status: DC
  Administered 2013-02-26 – 2013-02-27 (×2): 20 meq via INTRAVENOUS
  Filled 2013-02-26 (×2): qty 1000

## 2013-02-26 NOTE — Clinical Social Work Note (Signed)
CSW continuing to monitor patient's progress and will follow-up with family to determine if patient will return to [name].  Genelle BalVanessa Asante Blanda, MSW, LCSW [phone]

## 2013-02-26 NOTE — Progress Notes (Signed)
[facility] PROGRESS NOTE  Eric Graves ZOX:[id number] DOB: 27-Dec-1940 DOA: 02/15/2013 PCP: Bufford Spikes, DO  HPI/Brief narrative 73 y.o. male with hx of HTN, CVA with left side hemiplegia, dysphagia requiring nectar thickened food, HTN, hx of COPD, brought to the ER as he was having increase shortness of breath. He does not communicate and denied he was having shortness of breath. Evalaution in the ER showed CXR with mild vascular congestion, AKI with Cr of 6.64 and BUN of 132. His baseline Cr was 1.7. He has a WBC of 13K, a Na of 155, and normal LFTs. He was admitted for hypernatremic dehydration, acute on chronic renal failure and UTI. He failed swallow evaluation initially. She was hydrated with hypotonic IV fluids and sodium normalized. Creatinine started improving. He was started on dysphagia diet and aspirated. He went into acute hypoxic respiratory failure which precipitated A. fib with RVR. He was transferred to step down unit. He remains on IV antibiotics, amiodarone, fluids, n.p.o. No significant change in his mental status, respiratory status and creatinines have plateaued. Palliative care team are on board and await further discussion with family and recommendations. Nephrology and CCM signed off. Overall poor prognosis.   Assessment/Plan: Goals of care - Dr. Waymon Amato discussed at length with son/health care power of attorney Mr. Raeshawn Vo on 02/23/13 and advised him that patient is not making any significant improvement and in fact declining, has not had any form of nutrition for the last week and asked him if he and family had had any further discussions regarding further course for patient. He stated that he had none. Discussed with him about seemingly futile aggressive management at this time and to consider comfort oriented care.   I have had an extensive discussion today with his sons explaining all aspects of his care. He is unsafe to swallow and this has been  progressive and likely irreversible. I have approached them about comfort care however they are not willing to make any decision now. I explained that since he is unable to have any medications will have to rely on IV thus unable to go to a SNF. They seem to think that he would wish at this point to have everything done, although he has not talked to them over the past year. They feel like since he was content in an SNF bed for all this time he would want to continue living even if it means being dependent on IV medications, having recurrent aspiration episodes and essentially being bed bound. They acknowledge that this may not a good quality life but will not change anything in his care right now.   A. Fib with RVR - Started 12/26 PM. Likely precipitated by acute respiratory events. Will discontinue Amiodarone drip since he has been stable in sinus rhythm and schedule IV metoprolol.  - 2-D echo: EF 55-60%. Wall motion was normal; there were no regional wall motion abnormalities.  - Likely secondary to A. Fib with RVR and sepsis from aspiration pneumonia. His pneumonia has been treated as below.  - if he remain stable off Amiodarone, will transfer to floor in am and consult CM/SW for LTACH/other placement options. Aspiration pneumonia with sepsis/Dysphagia - On 12/26, following initiation of dysphagia diet, patient seems to have aspirated and went into acute respiratory failure and rapid A. Fib. - Changed to n.p.o. Status, broadened the antibiotic spectrum Zosyn and vancomycin, pulmonary toilet - Respiratory status overall is tenuous. - blood cultures continued to be negative, continue with Zosyn alone s/p  8 days of treatment 12/26 - 1/2 Acute respiratory failure - Apparently present on admission: He was placed on BiPAP on route to ED. - Most likely secondary to aspiration pneumonia and less likely due to pulmonary edema - Please see above - After going back and forth with family, patient currently  is DO NOT RESUSCITATE/DO NOT INTUBATE - No improvement in respiratory status. Remains on oxygen via facemask. - Aggressive pulmonary toilet Anion gap metabolic acidosis - Likely secondary to renal failure. Lactate and serum acetones normal. Less likely due to DKA. - Resolved after bicarbonate drip. DCed bicarbonate Acute on chronic renal failure-Principal admitting diagnosis - Likely secondary to volume depletion - Baseline creatinine in H&P was recorded as 1.7 but unsure as to when and where it was obtained from. - Nephrology consultation & followup appreciated. - Patient was oliguric all day yesterday:? Element of ATN. He did make 500 mL of urine last night. - Continue gentle IV fluids. Not candidate for hemodialysis. - Creatinine slowly improving. Nephrology signed off. Hypokalemia - Repleted Hypernatremic dehydration - Hypernatremia resolved. Resolved Anemia - Possibly chronic.  stable  Dysphagia - Aspirated on dysphagia diet. N.p.o. Discussed at length with 2 sons on 12/26 PM and 1/2. Henceforth, he probably will not be a good candidate for oral intake given high risk for recurrent aspiration. Advised them that PEG tube will also carry some risk of aspiration.  Type II DM- Uncontrolled. However due to n.p.o. status, change SSI to every 4 hours.Continue Lantus. Reasonably controlled. Failure to thrive - Overall poor short and long-term prognosis Hypertension: - start iv metop scheduled.   Code Status: DO NOT RESUSCITATE  Family Communication: sons Disposition Plan: To be determined. Floor transfer 1/3   Consultants:  Palliative care team   Nephrology  Critical care medicine  Procedures:  None   Antibiotics:  IV Rocephin DC'd  IV vancomycin 12/26 >   IV Zosyn 12/26 > 12/28  Subjective: Aphasic/nonverbal. Per nursing, not very responsive for the last 2 days.  Objective: Filed Vitals:   02/26/13 0015 02/26/13 0410 02/26/13 0742 02/26/13 0814  BP: 144/56  111/58 136/59   Pulse: 83 63 66 72  Temp: 98.2 F (36.8 C) 98.4 F (36.9 C) 97.7 F (36.5 C)   TempSrc: Axillary Axillary Axillary   Resp: 15 20 20 21   Height:      Weight:      SpO2: 100% 99% 99% 99%    Intake/Output Summary (Last 24 hours) at 02/26/13 1604 Last data filed at 02/26/13 0800  Gross per 24 hour  Intake  233.4 ml  Output    902 ml  Net -668.6 ml   Filed Weights   02/17/13 0348 02/18/13 0510 02/19/13 0440  Weight: 66.9 kg (147 lb 7.8 oz) 66.679 kg (147 lb) 65.953 kg (145 lb 6.4 oz)   Exam: General exam: Chronically ill looking elderly male in no obvious distress Respiratory system:   Reduced BS's B/L and scattered crackle posteriorly. No wheezing or rhonchi. No increased work of breathing. Cardiovascular system: S1 & S2 heard, RRR. No JVD, murmurs, gallops, clicks or pedal edema. telemetry: SR Gastrointestinal system: Abdomen is nondistended, soft and nontender. Normal bowel sounds heard. Central nervous system: Somnolent but easily arousable, nonverbal and looks around and drifts back to sleep. No new focal neurological deficits. Extremities: Symmetric 4x 5 power.  Data Reviewed: Basic Metabolic Panel:  Recent Labs Lab 02/20/13 0435 02/21/13 0535 02/22/13 0405 02/23/13 0518 02/24/13 0410 02/25/13 0450 02/26/13 0500  NA 142 142  143 140 142 139 136*  K 3.5 3.1* 3.5 4.0 4.0 4.6 4.8  CL 111 103 102 99 101 99 99  CO2 16* 26 26 24 26 24 23   GLUCOSE 337* 180* 129* 161* 119* 107* 206*  BUN 86* 83* 75* 65* 61* 54* 47*  CREATININE 4.66* 4.63* 4.78* 4.38* 4.35* 4.10* 3.75*  CALCIUM 6.7* 6.5* 6.9* 7.0* 7.1* 7.5* 7.0*  MG  --   --  1.5  --   --   --   --   PHOS 3.8 4.1 4.2 4.6 5.1*  --   --    Liver Function Tests:  Recent Labs Lab 02/19/13 1928 02/20/13 0435 02/21/13 0535 02/22/13 0405 02/23/13 0518 02/24/13 0410  AST 16  --   --   --   --   --   ALT 14  --   --   --   --   --   ALKPHOS 70  --   --   --   --   --   BILITOT 0.3  --   --   --   --    --   PROT 5.0*  --   --   --   --   --   ALBUMIN 1.4* 1.3* 1.3* 1.4* 1.4* 1.4*   CBC:  Recent Labs Lab 02/19/13 1928 02/21/13 0535 02/23/13 0518 02/25/13 0450  WBC 16.7* 14.3* 11.5* 10.1  HGB 10.4* 10.6* 11.5* 12.4*  HCT 32.7* 32.8* 35.2* 38.3*  MCV 95.9 92.1 91.4 91.8  PLT 289 301 412* 486*   Cardiac Enzymes:  Recent Labs Lab 02/19/13 2250 02/20/13 0143 02/20/13 0910  TROPONINI 0.38* 0.58* 0.70*   BNP (last 3 results)  Recent Labs  02/15/13 1908  PROBNP 987.2*   CBG:  Recent Labs Lab 02/25/13 1943 02/26/13 0016 02/26/13 0415 02/26/13 0742 02/26/13 1233  GLUCAP 107* 128* 118* 116* 150*    Recent Results (from the past 240 hour(s))  CULTURE, BLOOD (ROUTINE X 2)     Status: None   Collection Time    02/19/13  8:50 PM      Result Value Range Status   Specimen Description BLOOD HAND RIGHT   Final   Special Requests BOTTLES DRAWN AEROBIC ONLY 2CC   Final   Culture  Setup Time     Final   Value: 02/20/2013 01:32     Performed at [facility]   Culture     Final   Value: NO GROWTH 5 DAYS     Performed at [facility]   Report Status 02/26/2013 FINAL   Final  CULTURE, BLOOD (ROUTINE X 2)     Status: None   Collection Time    02/19/13 10:50 PM      Result Value Range Status   Specimen Description BLOOD RIGHT HAND   Final   Special Requests BOTTLES DRAWN AEROBIC ONLY 0.5CC   Final   Culture  Setup Time     Final   Value: 02/20/2013 12:50     Performed at [facility]   Culture     Final   Value: NO GROWTH 5 DAYS     Performed at [facility]   Report Status 02/26/2013 FINAL   Final      Scheduled Meds: . antiseptic oral rinse  15 mL Mouth Rinse BID  . aspirin  300 mg Rectal Daily  . heparin  5,000 Units Subcutaneous Q8H  . insulin aspart  0-9 Units Subcutaneous Q4H  .  insulin glargine  15 Units Subcutaneous QHS  . metoprolol  2.5 mg Intravenous Q6H  . piperacillin-tazobactam (ZOSYN)  IV  2.25 g Intravenous Q8H   . sodium chloride  3 mL Intravenous Q12H   Continuous Infusions: . dextrose 5 % with KCl 20 mEq / L 20 mEq (02/26/13 0600)   Principal Problem:   Renal failure, acute on chronic Active Problems:   Type II or unspecified type diabetes mellitus with neurological manifestations, not stated as uncontrolled(250.60)   Essential hypertension, benign   Other dysphagia   CVA (cerebral vascular accident)   COPD (chronic obstructive pulmonary disease)   AKI (acute kidney injury)   CHF (congestive heart failure)   DNR (do not resuscitate)   Dehydration with hypernatremia   Dysphagia, unspecified(787.20)   Aspiration pneumonia   Acute respiratory failure with hypoxia   Atrial fibrillation with RVR  Time spent: 35 minutes  Iya Hamed M. Elvera Lennox, MD [facility] [phone]  If 7PM-7AM, please contact night-coverage [email or web address] Password [email or web address] 02/26/2013, 4:04 PM    LOS: 11 days

## 2013-02-26 NOTE — Progress Notes (Signed)
Progress Note from the Palliative Medicine Team at [facility]  Subjective: Had further discussion with Eric Graves two sons Eric Graves is HCPOA. Dr. Elvera Lennox was present to explain his current situation. We explained that Eric Graves has multiple medical problems including not being able to swallow and aspiration leading to pneumonia and atrial fibrillation being treated with IV Amiodarone. They understand that he may even aspirate on his own saliva and will likely develop recurrent pneumonias. I explained that his time is limited and they say they understand that. They went on to say they couldn't live "feeling like I took one breath away from him." They are equating quality of life with quantity. They also say that they believe that "God will take him when he is ready either way." Eric Graves has been living in SNF and Eric Graves says he hasn't said more than 10 words in the past year and he has been on a dysphagia diet for months and they state they do believe this is quality of life for him. He is now unable to swallow any consistency safely. They say that they believe Eric Graves would want continued aggressive treatment (antibiotics, lab draws). They are unable and unwilling to make any decisions.    Objective: No Known Allergies Scheduled Meds: . antiseptic oral rinse  15 mL Mouth Rinse BID  . aspirin  300 mg Rectal Daily  . heparin  5,000 Units Subcutaneous Q8H  . insulin aspart  0-9 Units Subcutaneous Q4H  . insulin glargine  15 Units Subcutaneous QHS  . metoprolol  2.5 mg Intravenous Q6H  . piperacillin-tazobactam (ZOSYN)  IV  2.25 g Intravenous Q8H  . sodium chloride  3 mL Intravenous Q12H   Continuous Infusions: . amiodarone (NEXTERONE PREMIX) 360 mg/200 mL dextrose 30 mg/hr (02/26/13 0600)  . dextrose 5 % with KCl 20 mEq / L 20 mEq (02/26/13 0600)   PRN Meds:.acetaminophen, hydrALAZINE, levalbuterol, ondansetron (ZOFRAN) IV, ondansetron  BP 136/59  Pulse 72  Temp(Src) 97.7 F (36.5  C) (Axillary)  Resp 21  Ht 5\' 5"  (1.651 m)  Wt 65.953 kg (145 lb 6.4 oz)  BMI 24.20 kg/m2  SpO2 99%   PPS: 20%   Intake/Output Summary (Last 24 hours) at 02/26/13 1535 Last data filed at 02/26/13 0800  Gross per 24 hour  Intake  350.1 ml  Output    902 ml  Net -551.9 ml      LBM: 02/25/13      Physical Exam:  General: NAD, ill appearing, frail  HEENT: [place]/AT, no JVD Chest: Scattered crackles, decreased bases, shallow breathes CVS: RRR, S1 S2 Abdomen: Soft, NT, ND, Hypoactive +BS Ext: Warm to touch, bilat foot drop with slight mottling Neuro: Nonverbal, +tracking, unable to follow commands  Labs: CBC    Component Value Date/Time   WBC 10.1 02/25/2013 0450   RBC 4.17* 02/25/2013 0450   HGB 12.4* 02/25/2013 0450   HCT 38.3* 02/25/2013 0450   PLT 486* 02/25/2013 0450   MCV 91.8 02/25/2013 0450   MCH 29.7 02/25/2013 0450   MCHC 32.4 02/25/2013 0450   RDW 14.2 02/25/2013 0450   LYMPHSABS 1.8 02/15/2013 1908   MONOABS 1.6* 02/15/2013 1908   EOSABS 0.0 02/15/2013 1908   BASOSABS 0.0 02/15/2013 1908    BMET    Component Value Date/Time   NA 136* 02/26/2013 0500   K 4.8 02/26/2013 0500   CL 99 02/26/2013 0500   CO2 23 02/26/2013 0500   GLUCOSE 206* 02/26/2013 0500   BUN 47*  02/26/2013 0500   CREATININE 3.75* 02/26/2013 0500   CALCIUM 7.0* 02/26/2013 0500   GFRNONAA 15* 02/26/2013 0500   GFRAA 17* 02/26/2013 0500    CMP     Component Value Date/Time   NA 136* 02/26/2013 0500   K 4.8 02/26/2013 0500   CL 99 02/26/2013 0500   CO2 23 02/26/2013 0500   GLUCOSE 206* 02/26/2013 0500   BUN 47* 02/26/2013 0500   CREATININE 3.75* 02/26/2013 0500   CALCIUM 7.0* 02/26/2013 0500   PROT 5.0* 02/19/2013 1928   ALBUMIN 1.4* 02/24/2013 0410   AST 16 02/19/2013 1928   ALT 14 02/19/2013 1928   ALKPHOS 70 02/19/2013 1928   BILITOT 0.3 02/19/2013 1928   GFRNONAA 15* 02/26/2013 0500   GFRAA 17* 02/26/2013 0500     Assessment and Plan: 1. Code Status: DNR  2. Symptom Control: 1. Weakness: Continue medical therapy.   2. Nausea: Ondansetron prn.  3. Psycho/Social: Emotional support provided during frank discussion with family today. 4. Disposition: To be determined on outcomes.    Time In Time Out Total Time Spent with Patient Total Overall Time  1515 1550 20min 35min    Greater than 50%  of this time was spent counseling and coordinating care related to the above assessment and plan.  Yong ChannelAlicia Luvia Orzechowski, NP Palliative Medicine Team Team Phone # [phone]    1

## 2013-02-27 DIAGNOSIS — J9600 Acute respiratory failure, unspecified whether with hypoxia or hypercapnia: Secondary | ICD-10-CM

## 2013-02-27 DIAGNOSIS — J690 Pneumonitis due to inhalation of food and vomit: Secondary | ICD-10-CM

## 2013-02-27 DIAGNOSIS — Z515 Encounter for palliative care: Secondary | ICD-10-CM

## 2013-02-27 DIAGNOSIS — N179 Acute kidney failure, unspecified: Secondary | ICD-10-CM

## 2013-02-27 LAB — GLUCOSE, CAPILLARY
GLUCOSE-CAPILLARY: 94 mg/dL (ref 70–99)
GLUCOSE-CAPILLARY: 63 mg/dL — AB (ref 70–99)
GLUCOSE-CAPILLARY: 142 mg/dL — AB (ref 70–99)
GLUCOSE-CAPILLARY: 90 mg/dL (ref 70–99)
Glucose-Capillary: 88 mg/dL (ref 70–99)
Glucose-Capillary: 74 mg/dL (ref 70–99)
Glucose-Capillary: 71 mg/dL (ref 70–99)
Glucose-Capillary: 119 mg/dL — ABNORMAL HIGH (ref 70–99)

## 2013-02-27 MED ORDER — LORAZEPAM 2 MG/ML IJ SOLN
0.5000 mg | INTRAMUSCULAR | Status: DC | PRN
  Administered 2013-02-27: 0.5 mg via INTRAVENOUS
  Filled 2013-02-27: qty 1

## 2013-02-27 MED ORDER — DEXTROSE 50 % IV SOLN
INTRAVENOUS | Status: AC
  Administered 2013-02-27: 25 mL
  Filled 2013-02-27: qty 50

## 2013-02-27 MED ORDER — LORAZEPAM 2 MG/ML IJ SOLN
0.5000 mg | INTRAMUSCULAR | Status: DC | PRN
  Administered 2013-03-01 – 2013-03-02 (×2): 0.5 mg via INTRAVENOUS
  Filled 2013-02-27 (×2): qty 1

## 2013-02-27 MED ORDER — FENTANYL CITRATE 0.05 MG/ML IJ SOLN: 12.5000 ug | INTRAMUSCULAR | Status: DC | PRN

## 2013-02-27 MED ORDER — SCOPOLAMINE 1 MG/3DAYS TD PT72
1.0000 | MEDICATED_PATCH | TRANSDERMAL | Status: DC
  Administered 2013-02-27 – 2013-03-02 (×2): 1.5 mg via TRANSDERMAL
  Filled 2013-02-27 (×2): qty 1

## 2013-02-27 MED ORDER — POTASSIUM CL IN DEXTROSE 5% 20 MEQ/L IV SOLN
20.0000 meq | INTRAVENOUS | Status: DC
Start: 2013-02-27 — End: 2013-03-01
  Filled 2013-02-27 (×3): qty 1000

## 2013-02-27 MED ORDER — DEXTROSE 50 % IV SOLN: 25.0000 mL | Freq: Once | INTRAVENOUS | Status: DC | PRN

## 2013-02-27 NOTE — Consult Note (Signed)
Patient JJ:[id number] Sabet      DOB: January 13, 1941      WEX:[id number]     Consult Note from the Palliative Medicine Team at [facility] Requested by: Dr. Rockne Menghini     PCP: Hollace Kinnier, DO Reason for Consultation [place]     Phone Number:[phone]  Assessment of patients Current state: 73 yr old white male with past medical history for a stroke greater than 10 yrs ago who resides at a skilled nursing facility.  He has a persistent left sided hemiparisis, and a history of dysphagia. The patient presented with shortness of breath and was found to have vascular congestion and acute renal failure.  I met with the patient's son's .  The patient was treated for hypernatremia, and acute renal failure and on the day of my visit was much more awake and alert.  HIs son's feel that he has had poor quality of life but at this time remain steadfast in their desire to maintain curative treatments.  The family desires DNR/ DNI but would like to trial bipap if his breathing gets worse.Please see my note on the day of evaluation for further description of their desires.   Goals of Care: 1.  Code Status: DNR/ DNI use bipap   2. Scope of Treatment: Continue all curative treatments.  We will attempt to get speech therapy to see as soon as possible.   4. Disposition: to be determined.  Likely back to SNF if medically stable.  With chronic aspiration the patient's prognosis is six months or less.  We did start to speak some about hospice and comfort care.  Family experienced hospice with their mother's death.   3. Symptom Management:   1.  Dysphagia : strict aspiration percautions .  Request speech eval asap 2.  Dyspnea: improved.  Bipap if needed  4. Psychosocial: Two son's widowed.   5. Spiritual: Offered chaplain services.          Patient Documents Completed or Given: Document Given Completed  Advanced Directives Pkt    MOST    DNR    Gone from My Sight    Hard Choices      Brief  HPI: 73 yr old white male with history of CVA leaving him with expressive aphasia and left sided hemiplegia.  Patient admitted with shortness of breath and acute renal failure.   ROS: indicates that he wants a drink with hand motion.  Can not speak with limits ROS    PMH:  Past Medical History  Diagnosis Date  . Hyperlipidemia   . Diabetes mellitus without complication   . Anemia   . Hypertension   . Stroke   . Arthritis   . Dysphagia   . Vitamin D deficiency   . Depressive disorder, not elsewhere classified   . Unspecified hereditary and idiopathic peripheral neuropathy   . 9 completed weeks of gestation   . Malignant hypertensive heart/renal dis   . COPD (chronic obstructive pulmonary disease)      LFY:BOFBPZW reviewed. No pertinent past surgical history. I have reviewed the [place] and SH and  If appropriate update it with new information. No Known Allergies Scheduled Meds: . albuterol  2.5 mg Nebulization TID  . antiseptic oral rinse  15 mL Mouth Rinse BID  . cefTRIAXone (ROCEPHIN)  IV  1 g Intravenous Q24H  . Chlorhexidine Gluconate Cloth  6 each Topical Q0600  . heparin  5,000 Units Subcutaneous Q8H  . insulin aspart  0-15  Units Subcutaneous TID WC  . insulin aspart  0-5 Units Subcutaneous QHS  . insulin aspart  4 Units Subcutaneous TID WC  . insulin glargine  20 Units Subcutaneous QHS  . metoprolol  5 mg Intravenous Q6H  . mupirocin ointment  1 application Nasal BID  . sodium chloride  3 mL Intravenous Q12H   Continuous Infusions: . dextrose 75 mL/hr at 02/17/13 0747   PRN Meds:.albuterol, ondansetron (ZOFRAN) IV, ondansetron, RESOURCE THICKENUP CLEAR    BP 162/63  Pulse 89  Temp(Src) 98.4 F (36.9 C) (Oral)  Resp 18  Ht $R'5\' 5"'uQ$  (1.651 m)  Wt 66.9 kg (147 lb 7.8 oz)  BMI 24.54 kg/m2  SpO2 94%   PPS: 20%   Intake/Output Summary (Last 24 hours) at 02/17/13 1633 Last data filed at 02/17/13 1619  Gross per 24 hour  Intake 1285.5 ml  Output    895 ml   Net  390.5 ml     Physical Exam:  General: eyes open , non vebral but making hand gestures that he wants a drink HEENT:  [place], EOMI appear to be intact. MM Dry  Chest:   Decreased with bilateral crackles CVS: regular , rate and rhythm, S1, S2 Abdomen:soft, not tender, scaphoid Ext: contracted hemiparesis on the left, able to move hand and arm slightly on the right Neuro: expressive aphasia, but does appear to have ability to understand and answer some questions.  Labs: CBC    Component Value Date/Time   WBC 7.7 02/17/2013 0543   RBC 3.51* 02/17/2013 0543   HGB 10.6* 02/17/2013 0543   HCT 33.3* 02/17/2013 0543   PLT 298 02/17/2013 0543   MCV 94.9 02/17/2013 0543   MCH 30.2 02/17/2013 0543   MCHC 31.8 02/17/2013 0543   RDW 14.9 02/17/2013 0543   LYMPHSABS 1.8 02/15/2013 1908   MONOABS 1.6* 02/15/2013 1908   EOSABS 0.0 02/15/2013 1908   BASOSABS 0.0 02/15/2013 1908     CMP     Component Value Date/Time   NA 163* 02/17/2013 0543   K 3.7 02/17/2013 0543   CL 128* 02/17/2013 0543   CO2 18* 02/17/2013 0543   GLUCOSE 163* 02/17/2013 0543   BUN 122* 02/17/2013 0543   CREATININE 6.20* 02/17/2013 0543   CALCIUM 7.6* 02/17/2013 0543   PROT 6.0 02/15/2013 1908   ALBUMIN 1.8* 02/17/2013 0543   AST 31 02/15/2013 1908   ALT 24 02/15/2013 1908   ALKPHOS 76 02/15/2013 1908   BILITOT 0.4 02/15/2013 1908   GFRNONAA 8* 02/17/2013 0543   GFRAA 9* 02/17/2013 0543    Chest Xray Reviewed/Impressions:   Vascular congestion with mild interstitial changes.     Time In Time Out Total Time Spent with Patient Total Overall Time  303 pm  400 pm 57 min 57 min    Greater than 50%  of this time was spent counseling and coordinating care related to the above assessment and plan.  Vallie Teters L. Lovena Le, MD MBA The Palliative Medicine Team at [facility] Phone: [phone] Pager: [phone]

## 2013-02-27 NOTE — Progress Notes (Addendum)
Palliative Medicine Team Progress Note  I met with Mr. Shepard two sons who were at bedside. We had a good conversation about their dad-I provided constant reassurance that had doen and were doing everything possible to treat things that were reversible but his swallowing was simply not going to improve-his son said "so is where we are keep him comfortable?" I said yes. They both expressed understanding of this-they don't want him moved, they dont want him to suffer and they are expecting a hospital death-I did introduce the concept of Hospice but they were reluctant to make a decision on that- they would have to endorse GIP level care. I informed them that we should not continue to stick him for CBGs- we agreed to check once daily and stop his insulin which may have led to some lows-overall his sugars have been ok-will leave D5 at a KVO rate-I told them if he continues to get congested that we may need to stop the fluids completely. I recommend not treating hypoglycemia as this is unavoidable in his current condition. They agree to Ativan as need to keep him comfortable-I have also added on some fentanyl if he needs dyspnea and pain control. Will place a scop patch for his secretions.  Do not escalate is oxygen unless it contributes to his comfort. Oral suctioning.  Anticipate a hospital death. He has not eaten in >2 weeks.  35 minutes. Greater than 50%  of this time was spent counseling and coordinating care related to the above assessment and plan.   Lane Hacker, DO Palliative Medicine

## 2013-02-27 NOTE — Progress Notes (Signed)
[facility] PROGRESS NOTE  Alexsandro Salek VHQ:[id number] DOB: 1940/12/24 DOA: 02/15/2013 PCP: Bufford Spikes, DO  Assessment/Plan: Goals of care - Dr. Waymon Amato discussed at length with son/health care power of attorney Mr. Darshawn Boateng on 02/23/13 and advised him that patient is not making any significant improvement and in fact declining, has not had any form of nutrition for the last week and asked him if he and family had had any further discussions regarding further course for patient. He stated that he had none. Discussed with him about seemingly futile aggressive management at this time and to consider comfort oriented care.   I have had an extensive discussion 02/26/13 with his sons explaining all aspects of his care. He is unsafe to swallow and this has been progressive and likely irreversible. I have approached them about comfort care however they are not willing to make any decision now. They seem to think that he would wish at this point to have everything done, although he has not talked to them over the past year. They feel like since he was content in an SNF bed for all this time he would want to continue living even if it means being dependent on IV medications, having recurrent aspiration episodes and essentially being bed bound. They acknowledge that this may not a good quality life but will not change anything in his care right now.   A. Fib with RVR - Started 12/26 PM. Likely precipitated by acute respiratory events. Will discontinue Amiodarone drip since he has been stable in sinus rhythm and schedule IV metoprolol.  - 2-D echo: EF 55-60%. Wall motion was normal; there were no regional wall motion abnormalities.  - Likely secondary to A. Fib with RVR and sepsis from aspiration pneumonia. His pneumonia has been treated as below.  - stable overnight, will transfer today to floor.  Aspiration pneumonia with sepsis/Dysphagia - On 12/26, following initiation of dysphagia diet,  patient seems to have aspirated and went into acute respiratory failure and rapid A. Fib. - Changed to n.p.o. Status, broadened the antibiotic spectrum Zosyn and vancomycin, pulmonary toilet - Respiratory status overall is tenuous. - blood cultures continued to be negative, s/p 8 days of treatment with Zosyn 12/26 - 1/2 Acute respiratory failure - Apparently present on admission: He was placed on BiPAP on route to ED. - Most likely secondary to aspiration pneumonia and less likely due to pulmonary edema - After going back and forth with family, patient currently is DO NOT RESUSCITATE/DO NOT INTUBATE - on nasal cannula 02/27/13 morning Anion gap metabolic acidosis - Likely secondary to renal failure. Lactate and serum acetones normal. Less likely due to DKA. - Resolved after bicarbonate drip. Acute on chronic renal failure-Principal admitting diagnosis - Likely secondary to volume depletion - Baseline creatinine in H&P was recorded as 1.7 but unsure as to when and where it was obtained from. - Continue gentle IV fluids. Not candidate for hemodialysis. UOP 1.1L - Creatinine slowly improving. Nephrology signed off. Hypokalemia - Repleted Hypernatremic dehydration - Hypernatremia resolved. Resolved Anemia - Possibly chronic.  stable  Dysphagia - Aspirated on dysphagia diet. N.p.o. Discussed at length with 2 sons on 12/26 PM and 1/2. Henceforth, he probably will not be a good candidate for oral intake given high risk for recurrent aspiration. Advised them that PEG tube will also carry some risk of aspiration.  Type II DM- Uncontrolled. However due to n.p.o. status, change SSI to every 4 hours.Continue Lantus. Reasonably controlled. Failure to thrive - Overall poor  short and long-term prognosis Hypertension: - start iv metop scheduled.   Code Status: DO NOT RESUSCITATE  Family Communication: sons Disposition Plan: To be determined. Floor transfer 1/3   Consultants:  Palliative care team    Nephrology  Critical care medicine  Procedures:  None   Antibiotics:  IV Rocephin DC'd  IV vancomycin 12/26 >   IV Zosyn 12/26 > 12/28  Subjective: - non verbal  Objective: Filed Vitals:   02/26/13 2001 02/26/13 2333 02/27/13 0351 02/27/13 0400  BP: 129/54 149/62 112/52   Pulse: 63 85 84   Temp: 97.8 F (36.6 C) 98.7 F (37.1 C)  97.4 F (36.3 C)  TempSrc: Oral Oral  Oral  Resp: 24 29 29    Height:      Weight:      SpO2: 99% 95% 98%     Intake/Output Summary (Last 24 hours) at 02/27/13 0713 Last data filed at 02/27/13 0400  Gross per 24 hour  Intake 317.17 ml  Output   1100 ml  Net -782.83 ml   Filed Weights   02/17/13 0348 02/18/13 0510 02/19/13 0440  Weight: 66.9 kg (147 lb 7.8 oz) 66.679 kg (147 lb) 65.953 kg (145 lb 6.4 oz)   Exam: General exam: Chronically ill looking elderly male in no obvious distress Respiratory system:   Reduced BS's B/L and scattered crackle posteriorly. No wheezing or rhonchi. No increased work of breathing. Cardiovascular system: S1 & S2 heard, RRR. No JVD, murmurs, gallops, clicks or pedal edema. telemetry: SR Gastrointestinal system: Abdomen is nondistended, soft and nontender. Normal bowel sounds heard. Central nervous system: Somnolent but easily arousable, nonverbal and looks around and drifts back to sleep. No new focal neurological deficits. Extremities: doe not follow commands this morning.   Data Reviewed: Basic Metabolic Panel:  Recent Labs Lab 02/21/13 0535 02/22/13 0405 02/23/13 0518 02/24/13 0410 02/25/13 0450 02/26/13 0500  NA 142 143 140 142 139 136*  K 3.1* 3.5 4.0 4.0 4.6 4.8  CL 103 102 99 101 99 99  CO2 26 26 24 26 24 23   GLUCOSE 180* 129* 161* 119* 107* 206*  BUN 83* 75* 65* 61* 54* 47*  CREATININE 4.63* 4.78* 4.38* 4.35* 4.10* 3.75*  CALCIUM 6.5* 6.9* 7.0* 7.1* 7.5* 7.0*  MG  --  1.5  --   --   --   --   PHOS 4.1 4.2 4.6 5.1*  --   --    Liver Function Tests:  Recent Labs Lab  02/21/13 0535 02/22/13 0405 02/23/13 0518 02/24/13 0410  ALBUMIN 1.3* 1.4* 1.4* 1.4*   CBC:  Recent Labs Lab 02/21/13 0535 02/23/13 0518 02/25/13 0450  WBC 14.3* 11.5* 10.1  HGB 10.6* 11.5* 12.4*  HCT 32.8* 35.2* 38.3*  MCV 92.1 91.4 91.8  PLT 301 412* 486*   Cardiac Enzymes:  Recent Labs Lab 02/20/13 0910  TROPONINI 0.70*   BNP (last 3 results)  Recent Labs  02/15/13 1908  PROBNP 987.2*   CBG:  Recent Labs Lab 02/26/13 1233 02/26/13 1501 02/26/13 2141 02/26/13 2332 02/27/13 0350  GLUCAP 150* 120* 85 88 74    Recent Results (from the past 240 hour(s))  CULTURE, BLOOD (ROUTINE X 2)     Status: None   Collection Time    02/19/13  8:50 PM      Result Value Range Status   Specimen Description BLOOD HAND RIGHT   Final   Special Requests BOTTLES DRAWN AEROBIC ONLY 2CC   Final   Culture  Setup  Time     Final   Value: 02/20/2013 01:32     Performed at [facility]   Culture     Final   Value: NO GROWTH 5 DAYS     Performed at [facility]   Report Status 02/26/2013 FINAL   Final  CULTURE, BLOOD (ROUTINE X 2)     Status: None   Collection Time    02/19/13 10:50 PM      Result Value Range Status   Specimen Description BLOOD RIGHT HAND   Final   Special Requests BOTTLES DRAWN AEROBIC ONLY 0.5CC   Final   Culture  Setup Time     Final   Value: 02/20/2013 12:50     Performed at [facility]   Culture     Final   Value: NO GROWTH 5 DAYS     Performed at [facility]   Report Status 02/26/2013 FINAL   Final      Scheduled Meds: . antiseptic oral rinse  15 mL Mouth Rinse BID  . aspirin  300 mg Rectal Daily  . heparin  5,000 Units Subcutaneous Q8H  . insulin aspart  0-9 Units Subcutaneous Q4H  . insulin glargine  5 Units Subcutaneous QHS  . metoprolol  2.5 mg Intravenous Q6H  . sodium chloride  3 mL Intravenous Q12H   Continuous Infusions: . dextrose 5 % with KCl 20 mEq / L 20 mEq (02/27/13 0629)   Principal  Problem:   Renal failure, acute on chronic Active Problems:   Type II or unspecified type diabetes mellitus with neurological manifestations, not stated as uncontrolled(250.60)   Essential hypertension, benign   Other dysphagia   CVA (cerebral vascular accident)   COPD (chronic obstructive pulmonary disease)   AKI (acute kidney injury)   CHF (congestive heart failure)   DNR (do not resuscitate)   Dehydration with hypernatremia   Dysphagia, unspecified(787.20)   Aspiration pneumonia   Acute respiratory failure with hypoxia   Atrial fibrillation with RVR  Time spent: 25 minutes  Brantlee Penn M. Elvera LennoxGherghe, MD [facility] [phone]  If 7PM-7AM, please contact night-coverage [email or web address] Password [email or web address] 02/27/2013, 7:13 AM    LOS: 12 days

## 2013-02-27 NOTE — Progress Notes (Signed)
Transfer note:  Arrival Method: via bed from 3S Mental Orientation: Alert, questionable orientation, nonverbal Telemetry: N/A Assessment: See docflowsheets Skin: Intact, discolored bilat UE IV: RFA Pain: No pain Tubes: Condom cath  6E Orientation: Patient has been oriented to the unit, staff and to the room.  Family: Son at the bedside

## 2013-02-27 NOTE — Progress Notes (Addendum)
CBG 63, alert, given half amp of dextrose 50%. Will recheck CBG. CBG up to 142.  Continue to watch.

## 2013-02-27 NOTE — Progress Notes (Signed)
Son, Casimiro NeedleMichael, notified patient being transferred to [address].

## 2013-02-27 NOTE — Progress Notes (Signed)
I have reviewed and discussed the care of this patient in detail with the nurse practitioner including pertinent patient records, physical exam findings and data. I agree with details of this encounter.  

## 2013-02-28 LAB — GLUCOSE, CAPILLARY: Glucose-Capillary: 76 mg/dL (ref 70–99)

## 2013-02-28 NOTE — Progress Notes (Signed)
[facility]- Hospital death is expected for this patient. Sons are aware and in agreement.  Comfort measures in place.  CSW will monitor and support as needed.  Lorri Frederickonna T. West PughCrowder, LCSWA  [phone]

## 2013-02-28 NOTE — Progress Notes (Signed)
[facility] PROGRESS NOTE  Eric AlimentHarold Graves ZHY:[id number] DOB: [birth year] DOA: 02/15/2013 PCP: Bufford SpikesEED, TIFFANY, DO  Assessment/Plan: Goals of care - appreciate Dr. Phillips OdorGolding input into the case - d/c telemetry A. Fib with RVR - Started 12/26 PM. Likely precipitated by acute respiratory events. Will discontinue Amiodarone drip since he has been stable in sinus rhythm and schedule IV metoprolol.  - 2-D echo: EF 55-60%. Wall motion was normal; there were no regional wall motion abnormalities.  - Likely secondary to A. Fib with RVR and sepsis from aspiration pneumonia. His pneumonia has been treated as below.  - metop Aspiration pneumonia with sepsis/Dysphagia - blood cultures continued to be negative, s/p 8 days of treatment with Zosyn 12/26 - 1/2 Acute respiratory failure - on nasal cannula, O2 for comfort Anion gap metabolic acidosis - Likely secondary to renal failure. Lactate and serum acetones normal. Less likely due to DKA. - Resolved after bicarbonate drip. Acute on chronic renal failure-Principal admitting diagnosis - Likely secondary to volume depletion Hypokalemia Hypernatremic dehydration - Hypernatremia resolved. Anemia - chronic, stable  Dysphagia - Aspirated on dysphagia diet. NPO Type II DM Failure to thrive- Overall poor short and long-term prognosis Hypertension  Code Status: DO NOT RESUSCITATE  Family Communication: sons Disposition Plan: anticipate hospital death  Consultants:  Palliative care team   Nephrology  Critical care medicine  Procedures:  None   Antibiotics:  IV Rocephin DC'd  IV vancomycin 12/26 >   IV Zosyn 12/26 > 12/28  Subjective: - non verbal  Objective: Filed Vitals:   02/27/13 2200 02/28/13 0545 02/28/13 0902 02/28/13 1209  BP: 134/65 191/80 168/84 163/68  Pulse: 93 92 96 77  Temp: 98.6 F (37 C) 98.6 F (37 C) 97.6 F (36.4 C) 97.1 F (36.2 C)  TempSrc: Oral Axillary Axillary Axillary  Resp: 28 24 23 24    Height:      Weight:      SpO2: 97% 98% 97% 96%    Intake/Output Summary (Last 24 hours) at 02/28/13 1501 Last data filed at 02/28/13 0903  Gross per 24 hour  Intake    180 ml  Output      0 ml  Net    180 ml   Filed Weights   02/17/13 0348 02/18/13 0510 02/19/13 0440  Weight: 66.9 kg (147 lb 7.8 oz) 66.679 kg (147 lb) 65.953 kg (145 lb 6.4 oz)   Exam: General exam: Chronically ill looking elderly male in no obvious distress Respiratory system:   Reduced BS's B/L and scattered crackle posteriorly. No wheezing or rhonchi. No increased work of breathing. Cardiovascular system: Regular Gastrointestinal system: Abdomen is nondistended, soft and nontender. Normal bowel sounds heard. Central nervous system: Somnolent but easily arousable, nonverbal and looks around and drifts back to sleep. No new focal neurological deficits. Extremities: does not follow commands  Data Reviewed: Basic Metabolic Panel:  Recent Labs Lab 02/22/13 0405 02/23/13 0518 02/24/13 0410 02/25/13 0450 02/26/13 0500  NA 143 140 142 139 136*  K 3.5 4.0 4.0 4.6 4.8  CL 102 99 101 99 99  CO2 26 24 26 24 23   GLUCOSE 129* 161* 119* 107* 206*  BUN 75* 65* 61* 54* 47*  CREATININE 4.78* 4.38* 4.35* 4.10* 3.75*  CALCIUM 6.9* 7.0* 7.1* 7.5* 7.0*  MG 1.5  --   --   --   --   PHOS 4.2 4.6 5.1*  --   --    Liver Function Tests:  Recent Labs Lab 02/22/13 0405 02/23/13 0518 02/24/13  0410  ALBUMIN 1.4* 1.4* 1.4*   CBC:  Recent Labs Lab 02/23/13 0518 02/25/13 0450  WBC 11.5* 10.1  HGB 11.5* 12.4*  HCT 35.2* 38.3*  MCV 91.4 91.8  PLT 412* 486*   Cardiac Enzymes: No results found for this basename: CKTOTAL, CKMB, CKMBINDEX, TROPONINI,  in the last 168 hours BNP (last 3 results)  Recent Labs  02/15/13 1908  PROBNP 987.2*   CBG:  Recent Labs Lab 02/27/13 1141 02/27/13 1240 02/27/13 1544 02/27/13 1652 02/28/13 0949  GLUCAP 63* 142* 90 119* 76    Recent Results (from the past 240  hour(s))  CULTURE, BLOOD (ROUTINE X 2)     Status: None   Collection Time    02/19/13  8:50 PM      Result Value Range Status   Specimen Description BLOOD HAND RIGHT   Final   Special Requests BOTTLES DRAWN AEROBIC ONLY 2CC   Final   Culture  Setup Time     Final   Value: 02/20/2013 01:32     Performed at [facility]   Culture     Final   Value: NO GROWTH 5 DAYS     Performed at [facility]   Report Status 02/26/2013 FINAL   Final  CULTURE, BLOOD (ROUTINE X 2)     Status: None   Collection Time    02/19/13 10:50 PM      Result Value Range Status   Specimen Description BLOOD RIGHT HAND   Final   Special Requests BOTTLES DRAWN AEROBIC ONLY 0.5CC   Final   Culture  Setup Time     Final   Value: 02/20/2013 12:50     Performed at [facility]   Culture     Final   Value: NO GROWTH 5 DAYS     Performed at [facility]   Report Status 02/26/2013 FINAL   Final      Scheduled Meds: . antiseptic oral rinse  15 mL Mouth Rinse BID  . metoprolol  2.5 mg Intravenous Q6H  . scopolamine  1 patch Transdermal Q72H  . sodium chloride  3 mL Intravenous Q12H   Continuous Infusions: . dextrose 5 % with KCl 20 mEq / L 20 mEq (02/27/13 1830)   Principal Problem:   Renal failure, acute on chronic Active Problems:   Type II or unspecified type diabetes mellitus with neurological manifestations, not stated as uncontrolled(250.60)   Essential hypertension, benign   Other dysphagia   CVA (cerebral vascular accident)   COPD (chronic obstructive pulmonary disease)   AKI (acute kidney injury)   CHF (congestive heart failure)   DNR (do not resuscitate)   Dehydration with hypernatremia   Dysphagia, unspecified(787.20)   Aspiration pneumonia   Acute respiratory failure with hypoxia   Atrial fibrillation with RVR  Time spent: 25 minutes  Nimo Verastegui M. Elvera Lennox, MD [facility] [phone]  If 7PM-7AM, please contact  night-coverage [email or web address] Password [email or web address] 02/28/2013, 3:01 PM    LOS: 13 days

## 2013-03-01 LAB — GLUCOSE, CAPILLARY
GLUCOSE-CAPILLARY: 87 mg/dL (ref 70–99)
Glucose-Capillary: 91 mg/dL (ref 70–99)

## 2013-03-01 MED ORDER — LORAZEPAM 2 MG/ML IJ SOLN
1.0000 mg | Freq: Every day | INTRAMUSCULAR | Status: DC
  Administered 2013-03-01 – 2013-03-02 (×2): 1 mg via INTRAVENOUS
  Filled 2013-03-01 (×2): qty 1

## 2013-03-01 MED ORDER — POTASSIUM CL IN DEXTROSE 5% 20 MEQ/L IV SOLN
20.0000 meq | INTRAVENOUS | Status: DC
  Administered 2013-03-01: 20 meq via INTRAVENOUS
  Filled 2013-03-01: qty 1000

## 2013-03-01 NOTE — Progress Notes (Signed)
Nutrition Brief Note  Chart reviewed. Pt now transitioning to comfort care.  No further nutrition interventions warranted at this time.  Please re-consult as needed.   Amandy Chubbuck MS, RD, LDN Pager: [phone] After-hours pager: [phone]    

## 2013-03-01 NOTE — Progress Notes (Signed)
[facility] PROGRESS NOTE  Brion AlimentHarold Pilch ZOX:[id number] DOB: 29-Jun-1940 DOA: 02/15/2013 PCP: Bufford SpikesEED, TIFFANY, DO  Assessment/Plan: Goals of care - appreciate Dr. Phillips OdorGolding input into the case - d/c telemetry - patient with expected hospital death however seems stable. Palliative will touch base with family again today as he will need to be discharged to SNF with hospice A. Fib with RVR - Started 12/26 PM. Likely precipitated by acute respiratory events. Will discontinue Amiodarone drip since he has been stable in sinus rhythm and schedule IV metoprolol.  - 2-D echo: EF 55-60%. Wall motion was normal; there were no regional wall motion abnormalities.  - Likely secondary to A. Fib with RVR and sepsis from aspiration pneumonia. His pneumonia has been treated as below.  - in sinus, maintaining, d/c Metop Aspiration pneumonia with sepsis/Dysphagia - blood cultures continued to be negative, s/p 8 days of treatment with Zosyn 12/26 - 1/2 Acute respiratory failure - on nasal cannula, O2 for comfort. On room air this morning.  Anion gap metabolic acidosis - Likely secondary to renal failure. Lactate and serum acetones normal. Less likely due to DKA. - Resolved after bicarbonate drip. Acute on chronic renal failure-Principal admitting diagnosis - Likely secondary to volume depletion Hypokalemia Hypernatremic dehydration - Hypernatremia resolved. Anemia - chronic, stable  Dysphagia - Aspirated on dysphagia diet. NPO Type II DM Failure to thrive- Overall poor short term prognosis Hypertension  Code Status: DO NOT RESUSCITATE  Family Communication: sons Disposition Plan: TBD  Consultants:  Palliative care team   Nephrology  Critical care medicine  Procedures:  None   Antibiotics:  IV Rocephin DC'd  IV vancomycin 12/26 >   IV Zosyn 12/26 > 12/28  Subjective: - non verbal  Objective: Filed Vitals:   02/28/13 2133 03/01/13 0536 03/01/13 0903 03/01/13 1300  BP:  167/73 170/74 162/71 167/74  Pulse: 94 102 96 99  Temp: 97.7 F (36.5 C) 97.4 F (36.3 C) 97.1 F (36.2 C) 97.6 F (36.4 C)  TempSrc: Oral Axillary Axillary Axillary  Resp: 28 28 22 24   Height:      Weight:      SpO2: 99% 91% 96% 97%   No intake or output data in the 24 hours ending 03/01/13 1533 Filed Weights   02/17/13 0348 02/18/13 0510 02/19/13 0440  Weight: 66.9 kg (147 lb 7.8 oz) 66.679 kg (147 lb) 65.953 kg (145 lb 6.4 oz)   Exam: General exam: Alert Respiratory system:   Reduced BS's B/L and scattered crackle posteriorly. No wheezing or rhonchi. No increased work of breathing. Cardiovascular system: Regular Gastrointestinal system: Abdomen is nondistended, soft and nontender. Normal bowel sounds heard. Central nervous system: Nonverbal. No new focal neurological deficits. Extremities: does not follow commands  Data Reviewed: Basic Metabolic Panel:  Recent Labs Lab 02/23/13 0518 02/24/13 0410 02/25/13 0450 02/26/13 0500  NA 140 142 139 136*  K 4.0 4.0 4.6 4.8  CL 99 101 99 99  CO2 24 26 24 23   GLUCOSE 161* 119* 107* 206*  BUN 65* 61* 54* 47*  CREATININE 4.38* 4.35* 4.10* 3.75*  CALCIUM 7.0* 7.1* 7.5* 7.0*  PHOS 4.6 5.1*  --   --    Liver Function Tests:  Recent Labs Lab 02/23/13 0518 02/24/13 0410  ALBUMIN 1.4* 1.4*   CBC:  Recent Labs Lab 02/23/13 0518 02/25/13 0450  WBC 11.5* 10.1  HGB 11.5* 12.4*  HCT 35.2* 38.3*  MCV 91.4 91.8  PLT 412* 486*   BNP (last 3 results)  Recent Labs  02/15/13 1908  PROBNP 987.2*   CBG:  Recent Labs Lab 02/27/13 1240 02/27/13 1544 02/27/13 1652 02/28/13 0949 03/01/13 0756  GLUCAP 142* 90 119* 76 87    Recent Results (from the past 240 hour(s))  CULTURE, BLOOD (ROUTINE X 2)     Status: None   Collection Time    02/19/13  8:50 PM      Result Value Range Status   Specimen Description BLOOD HAND RIGHT   Final   Special Requests BOTTLES DRAWN AEROBIC ONLY 2CC   Final   Culture  Setup Time      Final   Value: 02/20/2013 01:32     Performed at [facility]   Culture     Final   Value: NO GROWTH 5 DAYS     Performed at [facility]   Report Status 02/26/2013 FINAL   Final  CULTURE, BLOOD (ROUTINE X 2)     Status: None   Collection Time    02/19/13 10:50 PM      Result Value Range Status   Specimen Description BLOOD RIGHT HAND   Final   Special Requests BOTTLES DRAWN AEROBIC ONLY 0.5CC   Final   Culture  Setup Time     Final   Value: 02/20/2013 12:50     Performed at [facility]   Culture     Final   Value: NO GROWTH 5 DAYS     Performed at [facility]   Report Status 02/26/2013 FINAL   Final      Scheduled Meds: . antiseptic oral rinse  15 mL Mouth Rinse BID  . metoprolol  2.5 mg Intravenous Q6H  . scopolamine  1 patch Transdermal Q72H  . sodium chloride  3 mL Intravenous Q12H   Continuous Infusions: . dextrose 5 % with KCl 20 mEq / L 20 mEq (02/27/13 1830)   Principal Problem:   Renal failure, acute on chronic Active Problems:   Type II or unspecified type diabetes mellitus with neurological manifestations, not stated as uncontrolled(250.60)   Essential hypertension, benign   Other dysphagia   CVA (cerebral vascular accident)   COPD (chronic obstructive pulmonary disease)   AKI (acute kidney injury)   CHF (congestive heart failure)   DNR (do not resuscitate)   Dehydration with hypernatremia   Dysphagia, unspecified(787.20)   Aspiration pneumonia   Acute respiratory failure with hypoxia   Atrial fibrillation with RVR  Time spent: 15 minutes  Sylvana Bonk M. Elvera Lennox, MD [facility] [phone]  If 7PM-7AM, please contact night-coverage [email or web address] Password [email or web address] 03/01/2013, 3:33 PM    LOS: 14 days

## 2013-03-01 NOTE — Progress Notes (Signed)
Palliative Medicine Team Progress Note  Eric Graves is remarkably alert, sitting up in bed, sounds less congested. According to his son he was able to say his son's name yesterday evening. They want him to be comfortable, but the also don't want to do anything that they feel may hasten his death. They understand his death is immanent and inevitable. His secretions responded well to the scop and atropine- I think he was pooling his saliva and oral secretions which was increasing his cough and upper airway congestion. He is now NPO for >2 weeks, he has been getting dextrose fluids, he is in renal failure.  I have discussed the following recommendations with his son Eric Graves:   One last SLP eval, small bites of comfort feeds as tolerated ok for now- for comfort only, he will not be able to meet his nutritional needs ever with oral route, I strongly advised against a feeding tube and son agreed that he would just pull it out-I also shared with him that it would lead to more immobility and more complications. If completely unable to take PO intake or PO intake causes patient more distress, we should continue complete NPO, oral care and allow for a natural death to occur- his dextrose is KVO at 10.  Son feels like patient is agitated and requests this be treated-he can get Ativan q2 prn-I will schedule a PM dose-if needed I will increase scheduled doses. Patient was agitated and fidgeting on my evaluation.  I discussed comfort as our primary goal-Mike does not want his dad moved from hospital because of the severe agitation that changes in his environment cause- I agree that agitation has been an issue and that if his dad is unable to swallow he would be a candidate for [facility] Care-Mike ok with this if it prevents his dad from being moved at EOL.  If he is able to swallow or further stabilizes he will have to [facility]- I strongly recommend that if he does return to [facility] that it be with  Hospice-I will discuss that option with his sons tomorrow after we see how he does with comfort feeding- should his dad aspirate again I recommended that we would keep him comfortable and not escalate his care due to the irreversible nature of his condition.   Will follow up tomorrow.  35 min. Greater than 50%  of this time was spent counseling and coordinating care related to the above assessment and plan.    Anderson MaltaElizabeth Golding, DO Palliative Medicine

## 2013-03-02 LAB — GLUCOSE, CAPILLARY: Glucose-Capillary: 117 mg/dL — ABNORMAL HIGH (ref 70–99)

## 2013-03-02 NOTE — Progress Notes (Signed)
Rested all night long. Alert and responsive. Follows command. Condom cath applied. Incontinent of bowel and bladder. Not in distress or pain noted.

## 2013-03-02 NOTE — Progress Notes (Signed)
Attempted to feed patient pudding, as a trial for comfort feeding. Despite sitting patient up at 90 degrees and encouraging swallowing numerous times with each bite and with chin tucked to chest, patient choked and aspirated with each of the couple of bites. After attempt at feeding, patient's mouth was swabbed with biotene, lips were moistened with biotene and patient was placed in a sitting position for at least 30 minutes after eating.  Will continue to monitor patient closely.

## 2013-03-02 NOTE — Progress Notes (Signed)
[facility] PROGRESS NOTE  Eric Graves ZOX:[id number] DOB: [birth year] DOA: 02/15/2013 PCP: Bufford Spikes, DO  Assessment/Plan: Goals of care - appreciate Dr. Phillips Odor input into the case A. Fib with RVR - Started 12/26 PM. Likely precipitated by acute respiratory events. Will discontinue Amiodarone drip since he has been stable in sinus rhythm and schedule IV metoprolol.  - 2-D echo: EF 55-60%. Wall motion was normal; there were no regional wall motion abnormalities.  - Likely secondary to A. Fib with RVR and sepsis from aspiration pneumonia. His pneumonia has been treated as below.  - in sinus, maintaining, d/c Metop Aspiration pneumonia with sepsis/Dysphagia - blood cultures continued to be negative, s/p 8 days of treatment with Zosyn 12/26 - 1/2 Acute respiratory failure - on nasal cannula, O2 for comfort. On room air this morning.  Anion gap metabolic acidosis - Likely secondary to renal failure. Lactate and serum acetones normal. Less likely due to DKA. - Resolved after bicarbonate drip. Acute on chronic renal failure-Principal admitting diagnosis - Likely secondary to volume depletion Hypokalemia Hypernatremic dehydration - Hypernatremia resolved. Anemia - chronic, stable  Dysphagia - Aspirated on dysphagia diet. NPO Type II DM Failure to thrive- Overall poor short term prognosis Hypertension  Code Status: DO NOT RESUSCITATE  Family Communication: none Disposition Plan: TBD  Consultants:  Palliative care team   Nephrology  Critical care medicine  Procedures:  None   Antibiotics:  IV Rocephin DC'd  IV vancomycin 12/26 >   IV Zosyn 12/26 > 12/28  Subjective: - non verbal  Objective: Filed Vitals:   03/01/13 1748 03/01/13 2041 03/02/13 0451 03/02/13 0855  BP: 167/74 178/72 153/77 127/74  Pulse: 96 101 99 79  Temp: 97.1 F (36.2 C) 97.3 F (36.3 C) 97.8 F (36.6 C) 98.5 F (36.9 C)  TempSrc: Axillary Axillary Axillary Axillary  Resp: 22  22 22 16   Height:      Weight:  67.189 kg (148 lb 2 oz)    SpO2: 96% 93% 93% 96%    Intake/Output Summary (Last 24 hours) at 03/02/13 1741 Last data filed at 03/02/13 1100  Gross per 24 hour  Intake  127.5 ml  Output      0 ml  Net  127.5 ml   Filed Weights   02/18/13 0510 02/19/13 0440 03/01/13 2041  Weight: 66.679 kg (147 lb) 65.953 kg (145 lb 6.4 oz) 67.189 kg (148 lb 2 oz)   Exam: General exam: Alert Respiratory system:   Reduced BS's B/L and scattered crackle posteriorly. No wheezing or rhonchi. No increased work of breathing. Cardiovascular system: Regular Gastrointestinal system: Abdomen is nondistended, soft and nontender. Normal bowel sounds heard. Central nervous system: Nonverbal. No new focal neurological deficits. Extremities: does not follow commands  Data Reviewed: Basic Metabolic Panel:  Recent Labs Lab 02/24/13 0410 02/25/13 0450 02/26/13 0500  NA 142 139 136*  K 4.0 4.6 4.8  CL 101 99 99  CO2 26 24 23   GLUCOSE 119* 107* 206*  BUN 61* 54* 47*  CREATININE 4.35* 4.10* 3.75*  CALCIUM 7.1* 7.5* 7.0*  PHOS 5.1*  --   --    Liver Function Tests:  Recent Labs Lab 02/24/13 0410  ALBUMIN 1.4*   CBC:  Recent Labs Lab 02/25/13 0450  WBC 10.1  HGB 12.4*  HCT 38.3*  MCV 91.8  PLT 486*   BNP (last 3 results)  Recent Labs  02/15/13 1908  PROBNP 987.2*   CBG:  Recent Labs Lab 02/27/13 1652 02/28/13 0949 03/01/13 0756  03/01/13 1644 03/02/13 0756  GLUCAP 119* 76 87 91 117*    No results found for this or any previous visit (from the past 240 hour(s)).    Scheduled Meds: . antiseptic oral rinse  15 mL Mouth Rinse BID  . LORazepam  1 mg Intravenous QHS  . scopolamine  1 patch Transdermal Q72H  . sodium chloride  3 mL Intravenous Q12H   Continuous Infusions: . dextrose 5 % with KCl 20 mEq / L 20 mEq (03/01/13 1815)   Principal Problem:   Renal failure, acute on chronic Active Problems:   Type II or unspecified type diabetes  mellitus with neurological manifestations, not stated as uncontrolled(250.60)   Essential hypertension, benign   Other dysphagia   CVA (cerebral vascular accident)   COPD (chronic obstructive pulmonary disease)   AKI (acute kidney injury)   CHF (congestive heart failure)   DNR (do not resuscitate)   Dehydration with hypernatremia   Dysphagia, unspecified(787.20)   Aspiration pneumonia   Acute respiratory failure with hypoxia   Atrial fibrillation with RVR  Time spent: 15 minutes  Costin M. Elvera LennoxGherghe, MD [facility] [phone]  If 7PM-7AM, please contact night-coverage [email or web address] Password [email or web address] 03/02/2013, 5:41 PM    LOS: 15 days

## 2013-03-03 DIAGNOSIS — J690 Pneumonitis due to inhalation of food and vomit: Secondary | ICD-10-CM

## 2013-03-03 DIAGNOSIS — J9600 Acute respiratory failure, unspecified whether with hypoxia or hypercapnia: Secondary | ICD-10-CM

## 2013-03-03 DIAGNOSIS — N179 Acute kidney failure, unspecified: Secondary | ICD-10-CM

## 2013-03-03 MED ORDER — SCOPOLAMINE 1 MG/3DAYS TD PT72: 1.0000 | MEDICATED_PATCH | TRANSDERMAL | Status: AC

## 2013-03-03 MED ORDER — MORPHINE SULFATE (CONCENTRATE) 20 MG/ML PO SOLN: 5.0000 mg | ORAL | Status: AC | PRN

## 2013-03-03 MED ORDER — LORAZEPAM 1 MG PO TABS: 1.0000 mg | ORAL_TABLET | ORAL | Status: AC | PRN

## 2013-03-03 NOTE — Clinical Social Work Note (Signed)
CSW rec'd request from MD (per son) to contact [place] [facility]) as patient is comfort care and family has decided on residential hospice.  CSW made contact with intake person Revonda Standardllison regarding patient and clinicals transmitted to facility. CSW rec'd call from Eunice BlaseDebbie [phone]) advising CSW that patient is appropriate for their facility and they have availability today. CSW contacted son Stark FallsMichael Neisler [phone]) to advise him of [facility] and he remained in agreement for patient to discharge there.   [facility] re-contacted and CSW advised to call ambulance to pick patient up at 8:30 pm for transport to hospice facility. [facility] contacted and transport arranged. Packet compiled and left with charge nurse for completion.  Genelle BalVanessa Dashia Caldeira, MSW, LCSW [phone]

## 2013-03-03 NOTE — Progress Notes (Signed)
[facility] [place]. Report called to Denny PeonErin, RN at hospice home. Patient remains stable for transportation; no signs or symptoms of immediate distress. Transport of patient to Hospice home via EMS is scheduled for 20:30 this evening.

## 2013-03-03 NOTE — Discharge Summary (Addendum)
Physician Discharge Summary  Eric Graves ZOX:[id number] DOB: [birth year] DOA: 02/15/2013  PCP: Bufford Spikes, DO  Admit date: 02/15/2013 Discharge date: 03/03/2013  Time spent: >35 minutes    Discharge Diagnoses:  Principal Problem:   Aspiration pneumonia Active Problems:   Type II or unspecified type diabetes mellitus with neurological manifestations, not stated as uncontrolled(250.60)   Essential hypertension, benign   Other dysphagia   CVA (cerebral vascular accident)   COPD (chronic obstructive pulmonary disease)   AKI (acute kidney injury)   CHF (congestive heart failure)   DNR (do not resuscitate)   Renal failure, acute on chronic   Dehydration with hypernatremia   Dysphagia, unspecified(787.20)   Acute respiratory failure with hypoxia   Atrial fibrillation with RVR   Discharge Condition: stable   Diet recommendation: as tolerated  Filed Weights   02/19/13 0440 03/01/13 2041 03/02/13 0855  Weight: 65.953 kg (145 lb 6.4 oz) 67.189 kg (148 lb 2 oz) 67.189 kg (148 lb 2 oz)    History of present illness:  Eric Graves is an 73 y.o. male with hx of HTN, CVA with left side hemiplegia, dysphagia requiring nectar thickened food, HTN, hx of COPD, brought to the ER as he was having increase shortness of breath. He does not communicate and denied he was having shortness of breath. Evalaution in the ER showed CXR with mild vascular congestion, AKI with Cr of 6.64 and BUN of 132. His baseline Cr was 1.7. He has a WBC of 13K, a Na of 155, and normal LFTs. Hospitalist was asked to admit him for AKI. He was started on VAN/Zosyn for ? PNA.   Hospital Course:   Note: Palliative care was consulted during the hospital stay due to severely deconditioned state and issues with aspiration. Patient was slowly transitioned to full comfort care.  A. Fib with RVR  - Started 12/26 PM. Suspected to be precipitated by acute respiratory events. Will discontinue Amiodarone drip since he has  been stable in sinus rhythm and schedule IV metoprolol.  - 2-D echo: EF 55-60%. Wall motion was normal; there were no regional wall motion abnormalities.  - Likely secondary to A. Fib with RVR and sepsis from aspiration pneumonia. His pneumonia has been treated as below.   Aspiration pneumonia with sepsis/Dysphagia  - blood cultures continued to be negative, s/p 8 days of treatment with Zosyn 12/26 - 1/2   Acute respiratory failure  - on nasal cannula, O2 for comfort. On room air this morning.   Anion gap metabolic acidosis  - Likely secondary to renal failure. Lactate and serum acetones normal. Less likely due to DKA.  - Resolved after bicarbonate drip.   Acute on chronic renal failure-Principal admitting diagnosis  - Likely secondary to volume depletion   Hypokalemia   Hypernatremic dehydration  - Hypernatremia resolved.   Anemia  - chronic, stable   Dysphagia  - Aspirated on dysphagia diet. NPO   Type II DM   Failure to thrive- Overall poor short term prognosis   Hypertension   Consultations:  Palliative care  Discharge Exam: Filed Vitals:   03/03/13 0758  BP: 165/83  Pulse: 88  Temp: 98.3 F (36.8 C)  Resp: 18    General: sleepy- awakens easily but not communicative Cardiovascular: RRR, no murmurs Respiratory: rhonchi b/l   Discharge Instructions     Medication List    STOP taking these medications       atorvastatin 20 MG tablet  Commonly known as:  LIPITOR  carvedilol 6.25 MG tablet  Commonly known as:  COREG     clopidogrel 75 MG tablet  Commonly known as:  PLAVIX     gabapentin 400 MG capsule  Commonly known as:  NEURONTIN     insulin aspart 100 UNIT/ML injection  Commonly known as:  novoLOG     insulin glargine 100 UNIT/ML injection  Commonly known as:  LANTUS     ipratropium-albuterol 0.5-2.5 (3) MG/3ML Soln  Commonly known as:  DUONEB     oseltamivir 75 MG capsule  Commonly known as:  TAMIFLU      TAKE these  medications       LORazepam 1 MG tablet  Commonly known as:  ATIVAN  Take 1 tablet (1 mg total) by mouth every 4 (four) hours as needed for anxiety.     morphine 20 MG/ML concentrated solution  Commonly known as:  ROXANOL  Take 0.25 mLs (5 mg total) by mouth every 4 (four) hours as needed for severe pain (respiratory distress).     scopolamine 1.5 MG  Commonly known as:  TRANSDERM-SCOP  Place 1 patch (1.5 mg total) onto the skin every 3 (three) days.       No Known Allergies    The results of significant diagnostics from this hospitalization (including imaging, microbiology, ancillary and laboratory) are listed below for reference.    Significant Diagnostic Studies: Dg Chest 2 View  02/18/2013   CLINICAL DATA:  Short of breath.  Congestion.  EXAM: CHEST  2 VIEW  COMPARISON:  02/15/2013  FINDINGS: There are low lung volumes. There are irregularly thickened bronchovascular markings bilaterally, stable. Or focal opacity is evident in the posterior lung base on the lateral view. A left lower lobe, or possibly by basilar, infiltrate should be suspected in the proper clinical setting.  No pleural effusion or pneumothorax.  The cardiac silhouette is normal in size. The mediastinum is normal in contour.  IMPRESSION: 1. Findings are similar to the recent prior exam. 2. Possible left lower lobe, or bibasilar, infiltrates.   Electronically Signed   By: Amie Portlandavid  Ormond M.D.   On: 02/18/2013 09:10   Dg Chest 2 View  02/15/2013   CLINICAL DATA:  Shortness of breath  EXAM: CHEST  2 VIEW  COMPARISON:  None.  FINDINGS: Cardiac shadow is within normal limits. Diffuse interstitial changes are identified bilaterally. Would be difficult to exclude a component acute congestive failure. No focal confluent infiltrate is seen. No acute bony abnormality is noted.  IMPRESSION: Vascular congestion with mild interstitial changes.   Electronically Signed   By: Alcide CleverMark  Lukens M.D.   On: 02/15/2013 18:25   [place]  Renal  02/16/2013   CLINICAL DATA:  Acute renal insufficiency, history of hypertension and diabetes  EXAM: RENAL/URINARY TRACT ULTRASOUND COMPLETE  COMPARISON:  None.  FINDINGS: Right Kidney:  Length: 11.2 cm. The echotexture of the renal cortex on the right is approximately equal to or slightly greater than that of the adjacent liver. There is an approximately 2.5 x 2.1 x 2.3 cm diameter anechoic structure consistent with a cyst in the upper pole. There is no hydronephrosis.  Left Kidney:  Length: 9.6 cm. The cortical echotexture on the left appears mildly increased. There is an approximately 1.8 x 1.6 x 1.6 cm cyst in the midpole as well as a 2.5 x 2.4 x 4.3 cm diameter cyst exophytic from the lower pole.  Bladder:  The urinary bladder contains a Foley catheter.  IMPRESSION: 1. The echotexture of the  renal cortex appears mildly increased bilaterally consistent with medical renal disease. There are no findings to suggest hydronephrosis. 2. There are simple appearing cysts in both kidneys.   Electronically Signed   By: David  [place]   On: 02/16/2013 09:09   Dg Chest Port 1 View  02/20/2013   CLINICAL DATA:  Aspiration pneumonia  EXAM: PORTABLE CHEST - 1 VIEW  COMPARISON:  February 19, 2013  FINDINGS: There is diffuse interstitial edema with slightly greater opacity in the bases compared to elsewhere. Heart is upper normal in size. The pulmonary vascularity suggests mild pulmonary venous hypertension. No pneumothorax. No adenopathy.  IMPRESSION: The overall appearance suggests a degree of congestive heart failure. Patchy opacity in both lower lobes could represent underlying pneumonia and/or aspiration. There is slightly more opacity in the left base compared to 1 day prior which could represent either edema or possibly aspiration in the appropriate clinical setting.   Electronically Signed   By: Bretta Bang M.D.   On: 02/20/2013 08:45   Dg Chest Port 1 View  02/19/2013   CLINICAL DATA:  Rapid  response. Respiratory distress. Evaluate for aspiration.  EXAM: PORTABLE CHEST - 1 VIEW  COMPARISON:  02/18/2013  FINDINGS: Heart size is normal. Small left pleural effusion is present. Lung volumes are low. There is chronic interstitial coarsening identified bilaterally suggestive of interstitial lung disease. There is an opacity within the left base which may represent pneumonia or aspiration.  IMPRESSION: 1. Left base opacity which may represent aspiration or pneumonia. 2. Chronic interstitial lung disease. 3. Left effusion.   Electronically Signed   By: Signa Kell M.D.   On: 02/19/2013 19:10   Dg Swallowing Func-speech Pathology  02/16/2013   Gray Bernhardt, CCC-SLP     02/16/2013 12:59 PM Objective Swallowing Evaluation: Modified Barium Swallowing Study   Patient Details  Name: Eric Graves MRN: [id number] Date of Birth: [birth year]  Today's Date: 02/16/2013 Time: 1200-1230 SLP Time Calculation (min): 30 min  Past Medical History:  Past Medical History  Diagnosis Date  . Hyperlipidemia   . Diabetes mellitus without complication   . Anemia   . Hypertension   . Stroke   . Arthritis   . Dysphagia   . Vitamin D deficiency   . Depressive disorder, not elsewhere classified   . Unspecified hereditary and idiopathic peripheral neuropathy   . 9 completed weeks of gestation   . Malignant hypertensive heart/renal dis   . COPD (chronic obstructive pulmonary disease)    Past Surgical History: History reviewed. No pertinent past  surgical history. HPI:  Eric Graves is an 73 y.o. male with hx of HTN, CVA with left  side hemiplegia, dysphagia requiring nectar thickened food, HTN,  hx of COPD, brought to the ER as he was having increase shortness  of breath.  He does not communicate, but denied he was having  shortness of breath.     Assessment / Plan / Recommendation Clinical Impression  Dysphagia Diagnosis: Severe oral phase dysphagia;Severe  pharyngeal phase dysphagia Clinical impression: Prior to start of MBS, pt  was noted to  exhibit congested breath sounds.  No vocalization elicited.  Pt  exhibits severe oropharyngeal sensory and motor based dysphagia.   Initial 1/2 tsp boluses elicited no swallow reflex.  Pt was noted  to hold bolus orally with absent formation or posterior  propulsion. Presentation of nectar thick liquid bolus resulted in  spillage to pyriform sinuses, and penetration and aspiration  during the swallow with no cough response.  Pt is unable to  follow directions for airway protection.  Remaining oral residue  was suctioned from pt mouth.  Pt is NOT SAFE for po intake at  this time.    Treatment Recommendation  Therapy as outlined in treatment plan below    Diet Recommendation NPO   Liquid Administration via: Cup;No straw;Spoon Medication Administration: Via alternative means Supervision: Full supervision/cueing for compensatory  strategies;Staff to assist with self feeding Compensations: Slow rate;Small sips/bites;Check for pocketing Postural Changes and/or Swallow Maneuvers: Seated upright 90  degrees;Upright 30-60 min after meal    Other  Recommendations Recommended Consults: MBS Oral Care Recommendations: Oral care Q4 per protocol Other Recommendations: Order thickener from pharmacy   Follow Up Recommendations  24 hour supervision/assistance;Skilled Nursing facility    Frequency and Duration min 1 x/week  1 week   Pertinent Vitals/Pain VSS, no pain indicated    SLP Swallow Goals  see care plan   General Date of Onset:  (chronic) HPI: Eric Graves is an 73 y.o. male with hx of HTN, CVA with  left side hemiplegia, dysphagia requiring nectar thickened food,  HTN, hx of COPD, brought to the ER as he was having increase  shortness of breath.  He does not communicate, but denied he was  having shortness of breath. Type of Study: Modified Barium Swallowing Study Reason for Referral: Objectively evaluate swallowing function Previous Swallow Assessment: BSE 02/16/13; OP MBS 09/26/11 with  recommendations fo  Dys 3 and nectar thick liquids with  consideration for water protocol Diet Prior to this Study: Regular;Nectar-thick liquids Temperature Spikes Noted: No Respiratory Status: Nasal cannula History of Recent Intubation: No Behavior/Cognition: Alert;Requires cueing;Doesn't follow  directions Oral Cavity - Dentition: Missing dentition Oral Motor / Sensory Function: Impaired - see Bedside swallow  eval Self-Feeding Abilities: Total assist Patient Positioning: Upright in chair Baseline Vocal Quality: Clear Volitional Cough: Cognitively unable to elicit Volitional Swallow: Unable to elicit Anatomy: Within functional limits Pharyngeal Secretions: Not observed secondary MBS    Reason for Referral Objectively evaluate swallowing function   Oral Phase Oral Preparation/Oral Phase Oral Phase: Impaired Oral - Nectar Oral - Nectar Teaspoon: Weak lingual manipulation;Lingual  pumping;Holding of bolus;Reduced posterior propulsion Oral - Nectar Straw: Weak lingual manipulation;Lingual  pumping;Holding of bolus;Reduced posterior propulsion;Piecemeal  swallowing Oral - Solids Oral - Puree: Weak lingual manipulation;Lingual  pumping;Incomplete tongue to palate contact;Reduced posterior  propulsion;Holding of bolus;Piecemeal swallowing   Pharyngeal Phase Pharyngeal Phase Pharyngeal Phase: Impaired Pharyngeal - Nectar Pharyngeal - Nectar Teaspoon:  (ABSENT SWALLOW) Pharyngeal - Nectar Straw: Penetration/Aspiration during  swallow;Premature spillage to pyriform sinuses;Reduced pharyngeal  peristalsis;Reduced epiglottic inversion;Reduced laryngeal  elevation;Reduced airway/laryngeal closure;Moderate  aspiration;Reduced tongue base retraction;Pharyngeal residue -  valleculae;Pharyngeal residue - posterior pharnyx Penetration/Aspiration details (nectar straw): Material enters  airway, passes BELOW cords without attempt by patient to eject  out (silent aspiration) Pharyngeal - Solids Pharyngeal - Puree:  (ABSENT SWALLOW) Pharyngeal Phase -  Comment Pharyngeal Comment: Puree consistency suctioned from oral cavity  Cervical Esophageal Phase    GO  Celia B. Murvin [facility], CCC-SLP [phone]   Cervical Esophageal Phase Cervical Esophageal Phase: [facility]         [name] 02/16/2013, 12:58 PM     Microbiology: No results found for this or any previous visit (from the past 240 hour(s)).   Labs: Basic Metabolic Panel:  Recent Labs Lab 02/25/13 0450 02/26/13 0500  NA 139 136*  K 4.6 4.8  CL 99 99  CO2 24 23  GLUCOSE 107* 206*  BUN 54* 47*  CREATININE 4.10* 3.75*  CALCIUM 7.5* 7.0*   Liver Function Tests: No results found for this basename: AST, ALT, ALKPHOS, BILITOT, PROT, ALBUMIN,  in the last 168 hours No results found for this basename: LIPASE, AMYLASE,  in the last 168 hours No results found for this basename: AMMONIA,  in the last 168 hours CBC:  Recent Labs Lab 02/25/13 0450  WBC 10.1  HGB 12.4*  HCT 38.3*  MCV 91.8  PLT 486*   Cardiac Enzymes: No results found for this basename: CKTOTAL, CKMB, CKMBINDEX, TROPONINI,  in the last 168 hours BNP: BNP (last 3 results)  Recent Labs  02/15/13 1908  PROBNP 987.2*   CBG:  Recent Labs Lab 02/27/13 1652 02/28/13 0949 03/01/13 0756 03/01/13 1644 03/02/13 0756  GLUCAP 119* 76 87 91 117*       Signed:  Landyn Buckalew  [facility] 03/03/2013, 6:02 PM

## 2013-03-03 NOTE — Progress Notes (Signed)
Palliative Medicine Team Progress Note  Mr. Eric Graves is relatively unchanged over the past few days. He has not had any significant PO intake in 3 weeks but he awake with advanced dementia, non-verbal, follows very simple commands intermitently. He has worsening issues with agitation requiring IV ativan prn. He has complete and terminal dysphagia.  I spoke with Eric Graves's son Eric Graves this evening in detail regarding his condition and next steps for his care. I stressed to Eric Graves that there is no reversible medical condition with his dad. I explored the possibilities of [facility] or going to a Hospice Facility for comfort as the best two options for his dad. Eric Graves tells me he cannot stand the thought of his Dad [facility]. He asked me specifically about [facility] in [place]- their mother died there a few years ago. He also tells me that [place] live in [place] and that [facility] would be easier on them. He feels confident that this will be the plan but he is going  To call and confirm with his brother tonight-will have definite decision in AM.   I strongly recommend Hospice Facility for his care- he is EOL, he needs IV or subcuteaneous access for his comfort meds-especially the Ativan. He is currently stable for transport to facility.   Prognosis: hours-days based on complete NPO status   Order placed for CSW to [facility] AM- I told Eric Graves that either CSW or CM would contact him to confirm his desire for [facility] and make arrangements.  Eric Graves and his brother understand their father is at EOL and that all reasonable treatment options have been exhausted-focus is comfort.  Eric MaltaElizabeth Bright Spielmann, DO Palliative Medicine

## 2013-03-12 ENCOUNTER — Encounter: Payer: Self-pay | Admitting: Internal Medicine

## 2013-03-28 DEATH — deceased

## 2014-09-13 IMAGING — US US RENAL
1 series · 14 of 25 positions shown · non-contrast
Comparison: None.

CLINICAL DATA: Acute renal insufficiency, history of hypertension
and diabetes

EXAM:
RENAL/URINARY TRACT ULTRASOUND COMPLETE

[Series 1: us renal · 0.25mm/px · 14 of 36 slices shown]
[im 1/36]
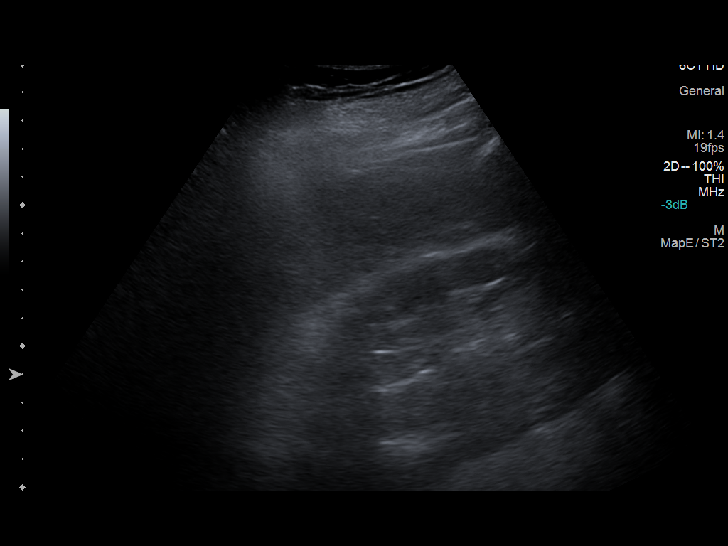
[im 3/36]
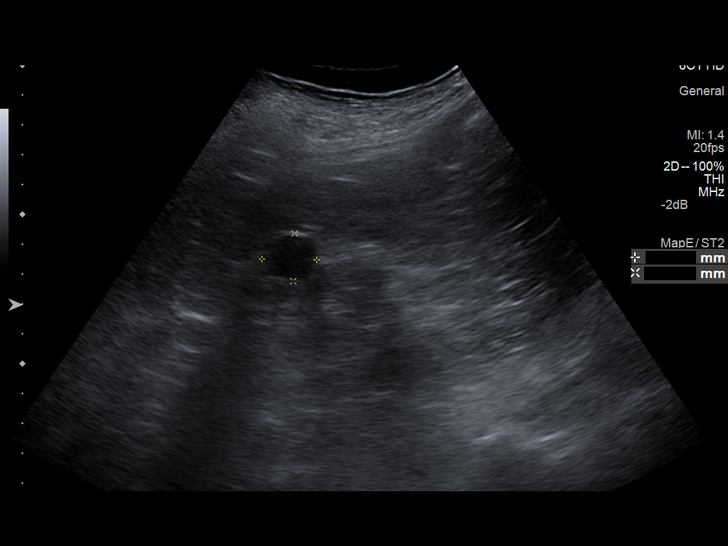
[im 6/36]
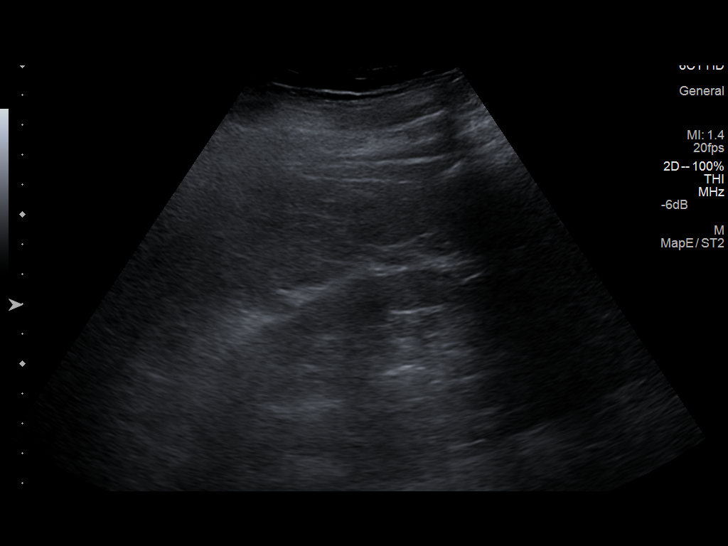
[im 9/36]
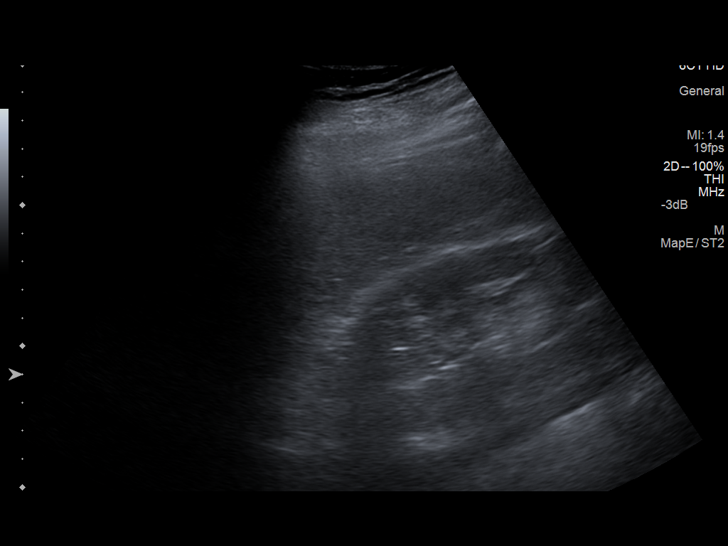
[im 12/36]
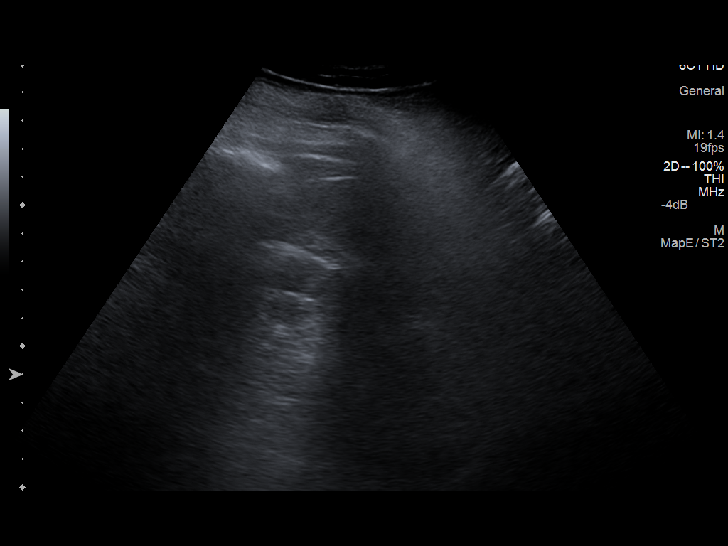
[im 14/36]
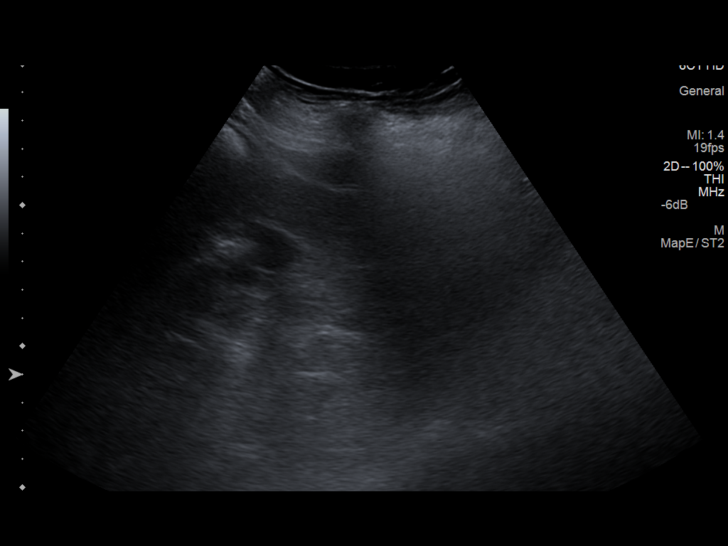
[im 17/36]
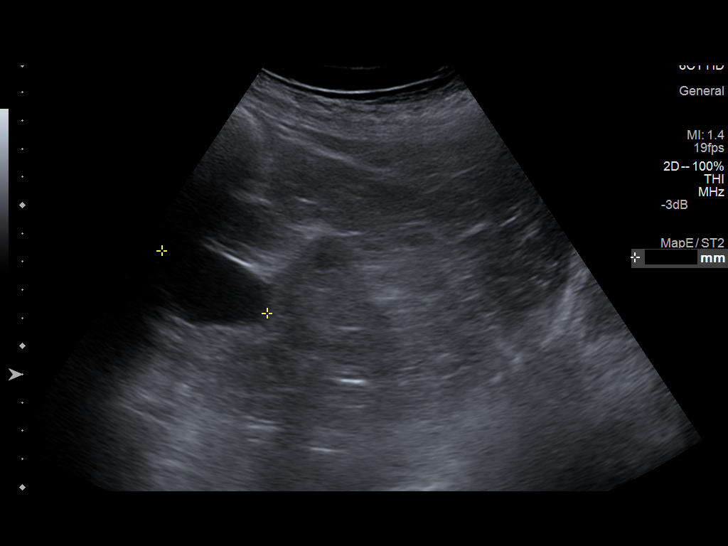
[im 19/36]
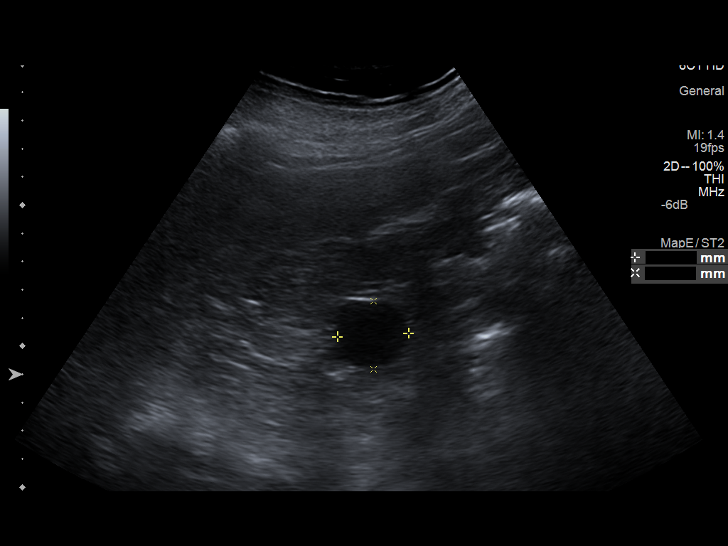
[im 22/36]
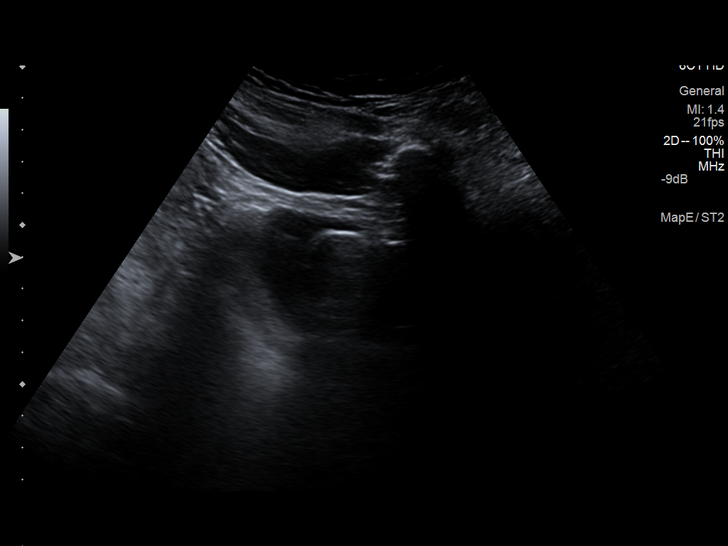
[im 24/36]
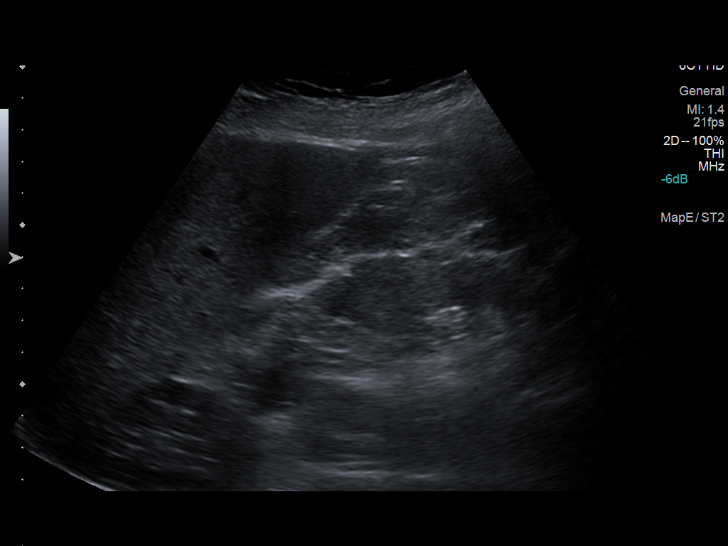
[im 27/36]
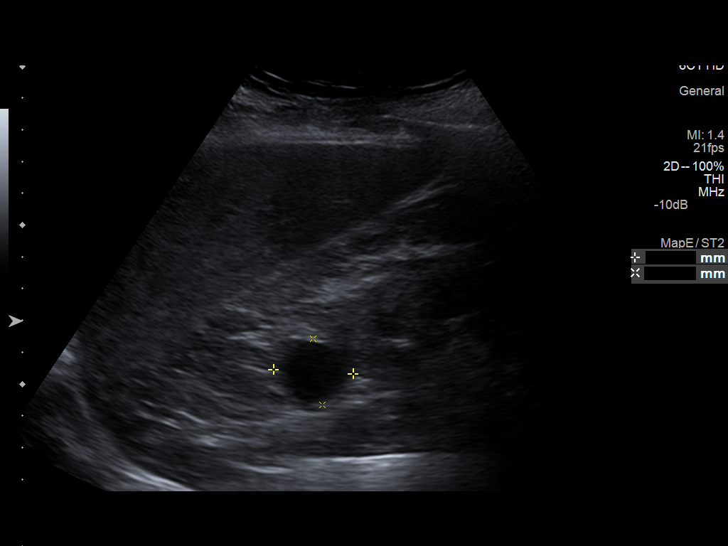
[im 30/36]
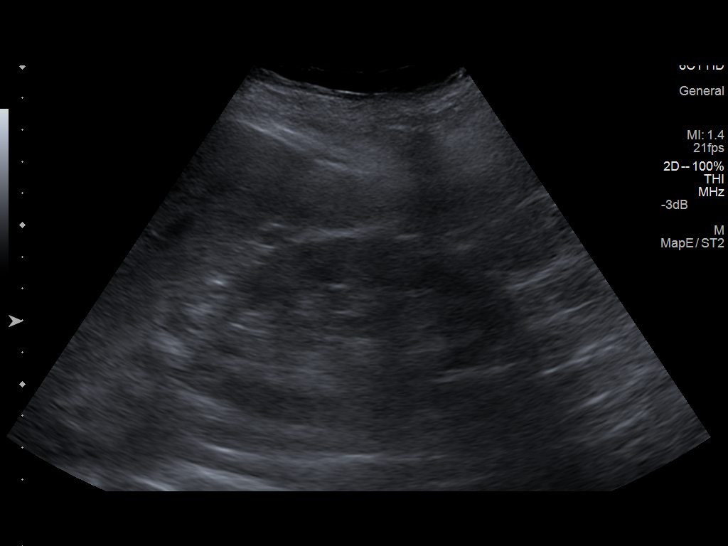
[im 33/36]
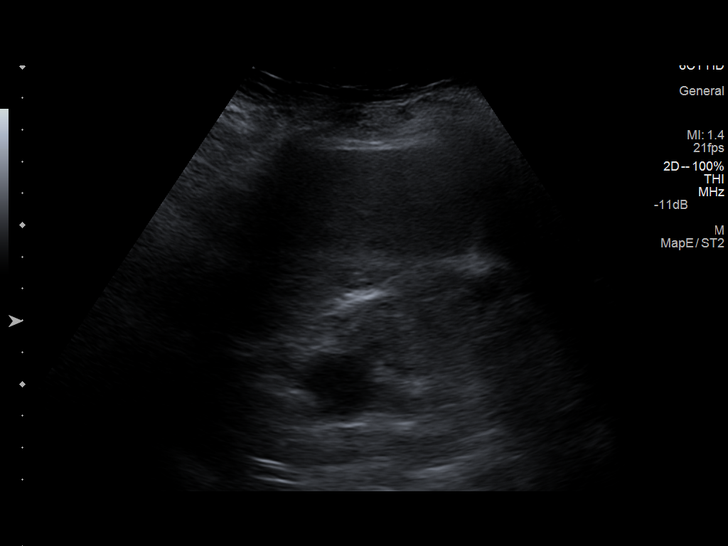
[im 36/36]
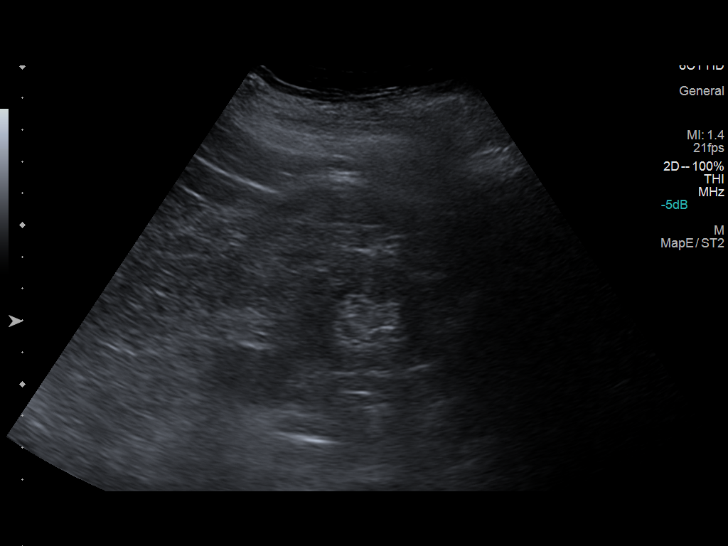

[14 of 25 positions shown; findings below may reference images not displayed]

FINDINGS: Right Kidney:

Length: 11.2 cm.. The echotexture of the renal cortex on the right
is approximately equal to or slightly greater than that of the
adjacent liver. There is an approximately 2.5 x 2.1 x 2.3 cm
diameter anechoic structure consistent with a cyst in the upper
pole. There is no hydronephrosis.

Left Kidney:

Length: 9.6 cm. The cortical echotexture on the left appears mildly
increased. There is an approximately 1.8 x 1.6 x 1.6 cm cyst in the
midpole as well as a 2.5 x 2.4 x 4.3 cm diameter cyst exophytic from
the lower pole.

Bladder:

The urinary bladder contains a Foley catheter.
IMPRESSION: 1. The echotexture of the renal cortex appears mildly increased
bilaterally consistent with medical renal disease. There are no
findings to suggest hydronephrosis.
2. There are simple appearing cysts in both kidneys.
